# Patient Record
Sex: Male | Born: 1955 | Race: Black or African American | Hispanic: No | Marital: Married | Smoking: Current every day smoker
Health system: Southern US, Community
[De-identification: ages and names within clinical notes are randomized; demographics above are authoritative.]

## PROBLEM LIST (undated history)

## (undated) ENCOUNTER — Emergency Department (HOSPITAL_BASED_OUTPATIENT_CLINIC_OR_DEPARTMENT_OTHER)

## (undated) ENCOUNTER — Ambulatory Visit (HOSPITAL_COMMUNITY): Admission: EM | Source: Home / Self Care

## (undated) DIAGNOSIS — Z720 Tobacco use: Secondary | ICD-10-CM

## (undated) DIAGNOSIS — I10 Essential (primary) hypertension: Secondary | ICD-10-CM

## (undated) DIAGNOSIS — Y249XXA Unspecified firearm discharge, undetermined intent, initial encounter: Secondary | ICD-10-CM

## (undated) DIAGNOSIS — W3400XA Accidental discharge from unspecified firearms or gun, initial encounter: Secondary | ICD-10-CM

## (undated) DIAGNOSIS — R001 Bradycardia, unspecified: Secondary | ICD-10-CM

## (undated) DIAGNOSIS — R079 Chest pain, unspecified: Secondary | ICD-10-CM

## (undated) HISTORY — PX: LAPAROTOMY: SHX154

---

## 1997-12-06 ENCOUNTER — Emergency Department (HOSPITAL_COMMUNITY): Admission: EM | Admit: 1997-12-06 | Discharge: 1997-12-06 | Payer: Self-pay | Admitting: Emergency Medicine

## 2012-06-29 ENCOUNTER — Encounter (HOSPITAL_COMMUNITY): Payer: Self-pay | Admitting: Emergency Medicine

## 2012-06-29 ENCOUNTER — Emergency Department (HOSPITAL_COMMUNITY): Payer: Self-pay

## 2012-06-29 ENCOUNTER — Observation Stay (HOSPITAL_COMMUNITY)
Admission: EM | Admit: 2012-06-29 | Discharge: 2012-07-01 | Disposition: A | Discharge status: Home / Self Care | Payer: 59 | Attending: Cardiology | Admitting: Cardiology

## 2012-06-29 DIAGNOSIS — R0789 Other chest pain: Principal | ICD-10-CM | POA: Insufficient documentation

## 2012-06-29 DIAGNOSIS — R079 Chest pain, unspecified: Secondary | ICD-10-CM

## 2012-06-29 DIAGNOSIS — Z5900 Homelessness unspecified: Secondary | ICD-10-CM | POA: Insufficient documentation

## 2012-06-29 DIAGNOSIS — F17200 Nicotine dependence, unspecified, uncomplicated: Secondary | ICD-10-CM | POA: Insufficient documentation

## 2012-06-29 DIAGNOSIS — R9439 Abnormal result of other cardiovascular function study: Secondary | ICD-10-CM | POA: Insufficient documentation

## 2012-06-29 DIAGNOSIS — E876 Hypokalemia: Secondary | ICD-10-CM | POA: Insufficient documentation

## 2012-06-29 DIAGNOSIS — I498 Other specified cardiac arrhythmias: Secondary | ICD-10-CM | POA: Insufficient documentation

## 2012-06-29 DIAGNOSIS — I10 Essential (primary) hypertension: Secondary | ICD-10-CM | POA: Insufficient documentation

## 2012-06-29 DIAGNOSIS — R072 Precordial pain: Secondary | ICD-10-CM

## 2012-06-29 DIAGNOSIS — F419 Anxiety disorder, unspecified: Secondary | ICD-10-CM | POA: Diagnosis present

## 2012-06-29 HISTORY — DX: Bradycardia, unspecified: R00.1

## 2012-06-29 HISTORY — DX: Tobacco use: Z72.0

## 2012-06-29 HISTORY — DX: Essential (primary) hypertension: I10

## 2012-06-29 HISTORY — DX: Accidental discharge from unspecified firearms or gun, initial encounter: W34.00XA

## 2012-06-29 HISTORY — DX: Unspecified firearm discharge, undetermined intent, initial encounter: Y24.9XXA

## 2012-06-29 HISTORY — DX: Chest pain, unspecified: R07.9

## 2012-06-29 LAB — CBC WITH DIFFERENTIAL/PLATELET
Basophils Relative: 1 % (ref 0–1)
Eosinophils Absolute: 0 10*3/uL (ref 0.0–0.7)
HCT: 42.4 % (ref 39.0–52.0)
Hemoglobin: 14.6 g/dL (ref 13.0–17.0)
MCH: 32.1 pg (ref 26.0–34.0)
MCHC: 34.4 g/dL (ref 30.0–36.0)
Monocytes Absolute: 0.4 10*3/uL (ref 0.1–1.0)
Monocytes Relative: 6 % (ref 3–12)
Neutrophils Relative %: 63 % (ref 43–77)
RDW: 13 % (ref 11.5–15.5)

## 2012-06-29 LAB — CBC
Hemoglobin: 15.4 g/dL (ref 13.0–17.0)
MCH: 32.5 pg (ref 26.0–34.0)
MCHC: 34.8 g/dL (ref 30.0–36.0)
Platelets: 232 10*3/uL (ref 150–400)

## 2012-06-29 LAB — BASIC METABOLIC PANEL
BUN: 7 mg/dL (ref 6–23)
Creatinine, Ser: 0.89 mg/dL (ref 0.50–1.35)
GFR calc Af Amer: >90 mL/min (ref >90)
GFR calc non Af Amer: >90 mL/min (ref >90)

## 2012-06-29 LAB — TROPONIN I
Troponin I: <0.3 ng/mL (ref <0.30)
Troponin I: <0.3 ng/mL (ref <0.30)

## 2012-06-29 LAB — CREATININE, SERUM: Creatinine, Ser: 0.99 mg/dL (ref 0.50–1.35)

## 2012-06-29 MED ORDER — AMLODIPINE BESYLATE 5 MG PO TABS
5.0000 mg | ORAL_TABLET | Freq: Every day | ORAL | Status: DC
  Administered 2012-06-29 – 2012-06-30 (×2): 5 mg via ORAL
  Filled 2012-06-29 (×3): qty 1

## 2012-06-29 MED ORDER — HYDROCHLOROTHIAZIDE 12.5 MG PO CAPS
12.5000 mg | ORAL_CAPSULE | Freq: Every day | ORAL | Status: DC
  Administered 2012-06-30: 12.5 mg via ORAL
  Filled 2012-06-29 (×3): qty 1

## 2012-06-29 MED ORDER — ONDANSETRON HCL 4 MG/2ML IJ SOLN: 4.0000 mg | Freq: Four times a day (QID) | INTRAMUSCULAR | Status: DC | PRN

## 2012-06-29 MED ORDER — NITROGLYCERIN 0.4 MG SL SUBL
0.4000 mg | SUBLINGUAL_TABLET | SUBLINGUAL | Status: DC | PRN
  Administered 2012-06-29 (×2): 0.4 mg via SUBLINGUAL

## 2012-06-29 MED ORDER — MORPHINE SULFATE 4 MG/ML IJ SOLN
4.0000 mg | Freq: Once | INTRAMUSCULAR | Status: AC
  Administered 2012-06-29: 4 mg via INTRAVENOUS
  Filled 2012-06-29: qty 1

## 2012-06-29 MED ORDER — PANTOPRAZOLE SODIUM 40 MG PO TBEC
40.0000 mg | DELAYED_RELEASE_TABLET | Freq: Every day | ORAL | Status: DC
  Administered 2012-06-29 – 2012-06-30 (×2): 40 mg via ORAL
  Filled 2012-06-29 (×2): qty 1

## 2012-06-29 MED ORDER — ENOXAPARIN SODIUM 40 MG/0.4ML ~~LOC~~ SOLN
40.0000 mg | SUBCUTANEOUS | Status: DC
  Filled 2012-06-29 (×3): qty 0.4

## 2012-06-29 MED ORDER — ASPIRIN EC 81 MG PO TBEC
81.0000 mg | DELAYED_RELEASE_TABLET | Freq: Every day | ORAL | Status: DC
  Administered 2012-06-30: 81 mg via ORAL
  Filled 2012-06-29 (×2): qty 1

## 2012-06-29 MED ORDER — POTASSIUM CHLORIDE CRYS ER 20 MEQ PO TBCR
40.0000 meq | EXTENDED_RELEASE_TABLET | Freq: Once | ORAL | Status: AC
  Administered 2012-06-29: 40 meq via ORAL
  Filled 2012-06-29: qty 2

## 2012-06-29 MED ORDER — NITROGLYCERIN 0.4 MG SL SUBL: 0.4000 mg | SUBLINGUAL_TABLET | SUBLINGUAL | Status: DC | PRN

## 2012-06-29 MED ORDER — ACETAMINOPHEN 325 MG PO TABS: 650.0000 mg | ORAL_TABLET | ORAL | Status: DC | PRN

## 2012-06-29 NOTE — Progress Notes (Signed)
  Echocardiogram 2D Echocardiogram has been performed.  Benjamin Pierce 06/29/2012, 3:31 PM

## 2012-06-29 NOTE — Progress Notes (Signed)
Pt had 7 beat run of Vtach; pt asymptomatic. Ward Givens, paged and made aware. No new orders received. Will continue to monitor pt.
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
## 2012-06-29 NOTE — ED Notes (Signed)
Pt reports chest pain this am and numbness to left arm.

## 2012-06-30 ENCOUNTER — Observation Stay (HOSPITAL_COMMUNITY): Payer: Self-pay

## 2012-06-30 ENCOUNTER — Other Ambulatory Visit: Payer: Self-pay

## 2012-06-30 ENCOUNTER — Encounter (HOSPITAL_COMMUNITY): Payer: Self-pay | Admitting: Cardiology

## 2012-06-30 DIAGNOSIS — R079 Chest pain, unspecified: Secondary | ICD-10-CM

## 2012-06-30 LAB — BASIC METABOLIC PANEL
BUN: 12 mg/dL (ref 6–23)
CO2: 31 mEq/L (ref 19–32)
GFR calc non Af Amer: 82 mL/min — ABNORMAL LOW (ref >90)
Glucose, Bld: 106 mg/dL — ABNORMAL HIGH (ref 70–99)
Potassium: 3.9 mEq/L (ref 3.5–5.1)

## 2012-06-30 LAB — LIPID PANEL
LDL Cholesterol: 79 mg/dL (ref 0–99)
Total CHOL/HDL Ratio: 2.5 RATIO
Triglycerides: 50 mg/dL (ref <150)
VLDL: 10 mg/dL (ref 0–40)

## 2012-06-30 MED ORDER — SODIUM CHLORIDE 0.9 % IJ SOLN: 3.0000 mL | INTRAMUSCULAR | Status: DC | PRN

## 2012-06-30 MED ORDER — REGADENOSON 0.4 MG/5ML IV SOLN
INTRAVENOUS | Status: AC
  Filled 2012-06-30: qty 5

## 2012-06-30 MED ORDER — DIAZEPAM 5 MG PO TABS
5.0000 mg | ORAL_TABLET | ORAL | Status: AC
  Administered 2012-07-01: 5 mg via ORAL
  Filled 2012-06-30: qty 1

## 2012-06-30 MED ORDER — TECHNETIUM TC 99M SESTAMIBI GENERIC - CARDIOLITE
10.0000 | Freq: Once | INTRAVENOUS | Status: AC | PRN
  Administered 2012-06-30: 10 via INTRAVENOUS

## 2012-06-30 MED ORDER — TECHNETIUM TC 99M SESTAMIBI GENERIC - CARDIOLITE
30.0000 | Freq: Once | INTRAVENOUS | Status: AC | PRN
  Administered 2012-06-30: 30 via INTRAVENOUS

## 2012-06-30 MED ORDER — SODIUM CHLORIDE 0.9 % IJ SOLN: 3.0000 mL | Freq: Two times a day (BID) | INTRAMUSCULAR | Status: DC

## 2012-06-30 MED ORDER — ASPIRIN 81 MG PO CHEW
324.0000 mg | CHEWABLE_TABLET | ORAL | Status: AC
  Administered 2012-07-01: 324 mg via ORAL
  Filled 2012-06-30 (×2): qty 4

## 2012-06-30 MED ORDER — REGADENOSON 0.4 MG/5ML IV SOLN
0.4000 mg | Freq: Once | INTRAVENOUS | Status: AC
  Administered 2012-06-30: 0.4 mg via INTRAVENOUS
  Filled 2012-06-30: qty 5

## 2012-06-30 MED ORDER — SODIUM CHLORIDE 0.9 % IV SOLN: 250.0000 mL | INTRAVENOUS | Status: DC | PRN

## 2012-06-30 MED ORDER — SODIUM CHLORIDE 0.9 % IV SOLN
1.0000 mL/kg/h | INTRAVENOUS | Status: DC
  Administered 2012-07-01: 1 mL/kg/h via INTRAVENOUS

## 2012-06-30 NOTE — Care Management Note (Signed)
    Page 1 of 1   06/30/2012     4:26:13 PM   CARE MANAGEMENT NOTE 06/30/2012  Patient:  Benjamin Pierce, Benjamin Pierce   Account Number:  [id number]  Date Initiated:  06/30/2012  Documentation initiated by:  Donn Pierini  Subjective/Objective Assessment:   Pt admitted with chest pain     Action/Plan:   PTA pt was staying at the [facility]-   Anticipated DC Date:  07/01/2012   Anticipated DC Plan:  HOME/SELF CARE  In-house referral  Clinical Social Worker      DC Planning Services  CM consult      Choice offered to / List presented to:             Status of service:  In process, will continue to follow Medicare Important Message given?   (If response is "NO", the following Medicare IM given date fields will be blank) Date Medicare IM given:   Date Additional Medicare IM given:    Discharge Disposition:    Per UR Regulation:  Reviewed for med. necessity/level of care/duration of stay  If discussed at Long Length of Stay Meetings, dates discussed:    Comments:  06/30/12- 1620- Donn Pierini RN, BSN [phone] Referral for d/c needs regarding medical care- spoke with pt at bedside- list of community resources for medical clinicals for uninsured given to pt- explanation given on how clinics work and fees- number also given for [facility]- pt expressed desire to speak with CSW regarding housing needs and other resources. Consult placed to CSW.

## 2012-06-30 NOTE — Progress Notes (Signed)
Utilization review completed.  

## 2012-06-30 NOTE — Progress Notes (Signed)
Nuclear stress test completed. Awaiting final results.  [place], PA-C 06/30/2012 2:09 PM  [phone] pgr
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
## 2012-06-30 NOTE — Progress Notes (Signed)
Pt brady down into 40s. Pt asymptomatic and sleeping at this time. Will continue to monitor. Levonne Spiller, RN

## 2012-06-30 NOTE — ED Provider Notes (Signed)
Medical screening examination/treatment/procedure(s) were performed by non-physician practitioner and as supervising physician I was immediately available for consultation/collaboration.   Lyanne Co, MD 06/30/12 [phone]
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
## 2012-07-01 ENCOUNTER — Encounter (HOSPITAL_COMMUNITY)
Admission: EM | Disposition: A | Discharge status: Home / Self Care | Payer: Self-pay | Source: Home / Self Care | Attending: Emergency Medicine

## 2012-07-01 ENCOUNTER — Encounter (HOSPITAL_COMMUNITY): Payer: Self-pay | Admitting: Cardiology

## 2012-07-01 DIAGNOSIS — R079 Chest pain, unspecified: Secondary | ICD-10-CM

## 2012-07-01 HISTORY — PX: LEFT HEART CATHETERIZATION WITH CORONARY ANGIOGRAM: SHX5451

## 2012-07-01 LAB — BASIC METABOLIC PANEL
BUN: 14 mg/dL (ref 6–23)
CO2: 27 mEq/L (ref 19–32)
Chloride: 105 mEq/L (ref 96–112)
GFR calc Af Amer: 90 mL/min — ABNORMAL LOW (ref >90)
Potassium: 3.4 mEq/L — ABNORMAL LOW (ref 3.5–5.1)

## 2012-07-01 LAB — APTT: aPTT: 33 seconds (ref 24–37)

## 2012-07-01 LAB — PROTIME-INR: Prothrombin Time: 12.4 seconds (ref 11.6–15.2)

## 2012-07-01 SURGERY — LEFT HEART CATHETERIZATION WITH CORONARY ANGIOGRAM
Anesthesia: LOCAL

## 2012-07-01 MED ORDER — HEPARIN (PORCINE) IN NACL 2-0.9 UNIT/ML-% IJ SOLN
INTRAMUSCULAR | Status: AC
  Filled 2012-07-01: qty 1000

## 2012-07-01 MED ORDER — VERAPAMIL HCL 2.5 MG/ML IV SOLN
INTRAVENOUS | Status: AC
  Filled 2012-07-01: qty 2

## 2012-07-01 MED ORDER — ACETAMINOPHEN 325 MG PO TABS: 650.0000 mg | ORAL_TABLET | ORAL | Status: DC | PRN

## 2012-07-01 MED ORDER — LIDOCAINE HCL (PF) 1 % IJ SOLN
INTRAMUSCULAR | Status: AC
  Filled 2012-07-01: qty 30

## 2012-07-01 MED ORDER — OXYCODONE-ACETAMINOPHEN 5-325 MG PO TABS: 1.0000 | ORAL_TABLET | ORAL | Status: DC | PRN

## 2012-07-01 MED ORDER — AMLODIPINE BESYLATE 10 MG PO TABS: 10.0000 mg | ORAL_TABLET | Freq: Every day | ORAL | Status: DC

## 2012-07-01 MED ORDER — FENTANYL CITRATE 0.05 MG/ML IJ SOLN
INTRAMUSCULAR | Status: AC
  Filled 2012-07-01: qty 2

## 2012-07-01 MED ORDER — POTASSIUM CHLORIDE CRYS ER 20 MEQ PO TBCR
40.0000 meq | EXTENDED_RELEASE_TABLET | Freq: Once | ORAL | Status: AC
  Administered 2012-07-01: 40 meq via ORAL
  Filled 2012-07-01: qty 2

## 2012-07-01 MED ORDER — SODIUM CHLORIDE 0.9 % IV SOLN: INTRAVENOUS | Status: AC

## 2012-07-01 MED ORDER — DIAZEPAM 2 MG PO TABS: 2.0000 mg | ORAL_TABLET | ORAL | Status: DC | PRN

## 2012-07-01 MED ORDER — MIDAZOLAM HCL 2 MG/2ML IJ SOLN
INTRAMUSCULAR | Status: AC
  Filled 2012-07-01: qty 2

## 2012-07-01 MED ORDER — HEPARIN SODIUM (PORCINE) 1000 UNIT/ML IJ SOLN
INTRAMUSCULAR | Status: AC
  Filled 2012-07-01: qty 1

## 2012-07-01 MED ORDER — ONDANSETRON HCL 4 MG/2ML IJ SOLN: 4.0000 mg | Freq: Four times a day (QID) | INTRAMUSCULAR | Status: DC | PRN
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
## 2012-07-01 NOTE — Progress Notes (Signed)
Pt provided with dc instructions and education. Pt verbalized understanding. Pt provided with prescription for new med and knows how to take it. IV removed with tip intact. Heart monitor cleaned and returned to front. Pt awaiting social work to bring a bus pass. Levonne Spiller, RN

## 2012-07-01 NOTE — CV Procedure (Signed)
   Cardiac Catheterization Procedure Note  Name: Benjamin Pierce MRN: [id number] DOB: 1955-10-23  Procedure: Left Heart Cath, Selective Coronary Angiography, LV angiography  Indication: Chest Pain Abnormal myovue   Procedural Details: The right wrist was prepped, draped, and anesthetized with 1% lidocaine. Using the modified Seldinger technique, a 5 French sheath was introduced into the right radial artery. 3 mg of verapamil was administered through the sheath, weight-based unfractionated heparin was administered intravenously. Standard Judkins catheters were used for selective coronary angiography and left ventriculography. Catheter exchanges were performed over an exchange length guidewire. There were no immediate procedural complications. A TR band was used for radial hemostasis at the completion of the procedure.  The patient was transferred to the post catheterization recovery area for further monitoring.  Procedural Findings: Hemodynamics: AO  105 65 LV 107 13  Coronary angiography: Coronary dominance: right  Left mainstem: Normal  Left anterior descending (LAD): Normal  D1: large artery and normal  D2: normal   Left circumflex (LCx): normal  OM1: large branch normal  Right coronary artery (RCA): Normal  PDA: small as most of mid and apical inferior wall supplied by wrap around LAD Normal  Left ventriculography: Left ventricular systolic function is normal, LVEF is estimated at 55-65%, there is no significant mitral regurgitation   Final Conclusions:  Normal Coronary Arteries  Recommendations: No CAD  Discharge home today False positive myovue  Charlton Haws 07/01/2012, 9:26 AM

## 2012-07-01 NOTE — Interval H&P Note (Signed)
History and Physical Interval Note:  07/01/2012 8:48 AM  Benjamin Pierce  has presented today for surgery, with the diagnosis of cp  The various methods of treatment have been discussed with the patient and family. After consideration of risks, benefits and other options for treatment, the patient has consented to  Procedure(s): LEFT HEART CATHETERIZATION WITH CORONARY ANGIOGRAM (N/A) as a surgical intervention .  The patient's history has been reviewed, patient examined, no change in status, stable for surgery.  I have reviewed the patient's chart and labs.  Questions were answered to the patient's satisfaction.     Charlton Haws

## 2012-07-02 ENCOUNTER — Telehealth: Payer: Self-pay | Admitting: *Deleted

## 2012-07-02 NOTE — Telephone Encounter (Signed)
TOC management pc to pt. Patient is homeless. Listed shelter as his home address but he did not come to phone when his name was called by the employee at the shelter.  There is no one listed as an emergency contact.

## 2012-10-17 ENCOUNTER — Emergency Department (HOSPITAL_COMMUNITY): Payer: Self-pay

## 2012-10-17 ENCOUNTER — Encounter (HOSPITAL_COMMUNITY): Payer: Self-pay | Admitting: *Deleted

## 2012-10-17 ENCOUNTER — Inpatient Hospital Stay (HOSPITAL_COMMUNITY)
Admission: EM | Admit: 2012-10-17 | Discharge: 2012-10-21 | DRG: 605 | Disposition: A | Discharge status: Home / Self Care | Payer: MEDICAID | Attending: General Surgery | Admitting: General Surgery

## 2012-10-17 DIAGNOSIS — E041 Nontoxic single thyroid nodule: Secondary | ICD-10-CM | POA: Diagnosis present

## 2012-10-17 DIAGNOSIS — F1410 Cocaine abuse, uncomplicated: Secondary | ICD-10-CM | POA: Diagnosis present

## 2012-10-17 DIAGNOSIS — S0003XA Contusion of scalp, initial encounter: Secondary | ICD-10-CM | POA: Diagnosis present

## 2012-10-17 DIAGNOSIS — Z5900 Homelessness unspecified: Secondary | ICD-10-CM

## 2012-10-17 DIAGNOSIS — S0181XA Laceration without foreign body of other part of head, initial encounter: Secondary | ICD-10-CM

## 2012-10-17 DIAGNOSIS — F17200 Nicotine dependence, unspecified, uncomplicated: Secondary | ICD-10-CM | POA: Diagnosis present

## 2012-10-17 DIAGNOSIS — F329 Major depressive disorder, single episode, unspecified: Secondary | ICD-10-CM | POA: Diagnosis present

## 2012-10-17 DIAGNOSIS — S1093XA Contusion of unspecified part of neck, initial encounter: Secondary | ICD-10-CM | POA: Diagnosis present

## 2012-10-17 DIAGNOSIS — IMO0002 Reserved for concepts with insufficient information to code with codable children: Secondary | ICD-10-CM

## 2012-10-17 DIAGNOSIS — S0230XA Fracture of orbital floor, unspecified side, initial encounter for closed fracture: Secondary | ICD-10-CM | POA: Diagnosis present

## 2012-10-17 DIAGNOSIS — S060X0A Concussion without loss of consciousness, initial encounter: Secondary | ICD-10-CM | POA: Diagnosis present

## 2012-10-17 DIAGNOSIS — S0280XA Fracture of other specified skull and facial bones, unspecified side, initial encounter for closed fracture: Secondary | ICD-10-CM | POA: Diagnosis present

## 2012-10-17 DIAGNOSIS — R4182 Altered mental status, unspecified: Secondary | ICD-10-CM | POA: Diagnosis present

## 2012-10-17 DIAGNOSIS — S0180XA Unspecified open wound of other part of head, initial encounter: Secondary | ICD-10-CM

## 2012-10-17 DIAGNOSIS — I10 Essential (primary) hypertension: Secondary | ICD-10-CM | POA: Diagnosis present

## 2012-10-17 DIAGNOSIS — S060XAA Concussion with loss of consciousness status unknown, initial encounter: Secondary | ICD-10-CM

## 2012-10-17 DIAGNOSIS — F3289 Other specified depressive episodes: Secondary | ICD-10-CM | POA: Diagnosis present

## 2012-10-17 DIAGNOSIS — S02411A LeFort I fracture, initial encounter for closed fracture: Secondary | ICD-10-CM

## 2012-10-17 DIAGNOSIS — Y929 Unspecified place or not applicable: Secondary | ICD-10-CM

## 2012-10-17 DIAGNOSIS — S02670A Fracture of alveolus of mandible, unspecified side, initial encounter for closed fracture: Secondary | ICD-10-CM | POA: Diagnosis present

## 2012-10-17 DIAGNOSIS — S060X9A Concussion with loss of consciousness of unspecified duration, initial encounter: Secondary | ICD-10-CM

## 2012-10-17 DIAGNOSIS — I498 Other specified cardiac arrhythmias: Secondary | ICD-10-CM | POA: Diagnosis present

## 2012-10-17 LAB — URINALYSIS, ROUTINE W REFLEX MICROSCOPIC
Nitrite: NEGATIVE
Protein, ur: NEGATIVE mg/dL
Specific Gravity, Urine: 1.028 (ref 1.005–1.030)
Urobilinogen, UA: 1 mg/dL (ref 0.0–1.0)

## 2012-10-17 LAB — COMPREHENSIVE METABOLIC PANEL
Albumin: 3.6 g/dL (ref 3.5–5.2)
BUN: 19 mg/dL (ref 6–23)
Creatinine, Ser: 1.01 mg/dL (ref 0.50–1.35)
GFR calc Af Amer: >90 mL/min (ref >90)
Glucose, Bld: 89 mg/dL (ref 70–99)
Total Bilirubin: 0.5 mg/dL (ref 0.3–1.2)
Total Protein: 7.8 g/dL (ref 6.0–8.3)

## 2012-10-17 LAB — ACETAMINOPHEN LEVEL: Acetaminophen (Tylenol), Serum: <15 ug/mL (ref 10–30)

## 2012-10-17 LAB — CBC WITH DIFFERENTIAL/PLATELET
Basophils Absolute: 0 10*3/uL (ref 0.0–0.1)
Eosinophils Absolute: 0 10*3/uL (ref 0.0–0.7)
Hemoglobin: 14.7 g/dL (ref 13.0–17.0)
Lymphs Abs: 1.8 10*3/uL (ref 0.7–4.0)
MCH: 32.5 pg (ref 26.0–34.0)
MCHC: 34.9 g/dL (ref 30.0–36.0)
MCV: 93.1 fL (ref 78.0–100.0)
Neutro Abs: 7.4 10*3/uL (ref 1.7–7.7)
RBC: 4.52 MIL/uL (ref 4.22–5.81)
RDW: 12.7 % (ref 11.5–15.5)
WBC: 9.8 10*3/uL (ref 4.0–10.5)

## 2012-10-17 LAB — RAPID URINE DRUG SCREEN, HOSP PERFORMED
Barbiturates: NOT DETECTED
Tetrahydrocannabinol: NOT DETECTED

## 2012-10-17 LAB — ETHANOL: Alcohol, Ethyl (B): <11 mg/dL (ref 0–11)

## 2012-10-17 LAB — SALICYLATE LEVEL: Salicylate Lvl: <2 mg/dL — ABNORMAL LOW (ref 2.8–20.0)

## 2012-10-17 MED ORDER — ONDANSETRON HCL 4 MG PO TABS: 4.0000 mg | ORAL_TABLET | Freq: Four times a day (QID) | ORAL | Status: DC | PRN

## 2012-10-17 MED ORDER — TETANUS-DIPHTH-ACELL PERTUSSIS 5-2.5-18.5 LF-MCG/0.5 IM SUSP
0.5000 mL | Freq: Once | INTRAMUSCULAR | Status: AC
  Administered 2012-10-17: 0.5 mL via INTRAMUSCULAR
  Filled 2012-10-17: qty 0.5

## 2012-10-17 MED ORDER — HYDROMORPHONE HCL PF 1 MG/ML IJ SOLN
0.5000 mg | INTRAMUSCULAR | Status: DC | PRN
  Administered 2012-10-18 (×3): 1 mg via INTRAVENOUS
  Administered 2012-10-18: 0.5 mg via INTRAVENOUS
  Administered 2012-10-18 – 2012-10-20 (×9): 1 mg via INTRAVENOUS
  Filled 2012-10-17 (×13): qty 1

## 2012-10-17 MED ORDER — BIOTENE DRY MOUTH MT LIQD
15.0000 mL | OROMUCOSAL | Status: DC
  Administered 2012-10-17 – 2012-10-21 (×37): 15 mL via OROMUCOSAL

## 2012-10-17 MED ORDER — KCL IN DEXTROSE-NACL 20-5-0.45 MEQ/L-%-% IV SOLN
INTRAVENOUS | Status: DC
  Administered 2012-10-17 – 2012-10-18 (×4): via INTRAVENOUS
  Filled 2012-10-17 (×6): qty 1000

## 2012-10-17 MED ORDER — LORAZEPAM 1 MG PO TABS
1.0000 mg | ORAL_TABLET | Freq: Once | ORAL | Status: DC
  Filled 2012-10-17: qty 1

## 2012-10-17 MED ORDER — LORAZEPAM 2 MG/ML IJ SOLN
2.0000 mg | Freq: Once | INTRAMUSCULAR | Status: AC
  Administered 2012-10-17: 2 mg via INTRAMUSCULAR

## 2012-10-17 MED ORDER — ACETAMINOPHEN 325 MG PO TABS
650.0000 mg | ORAL_TABLET | ORAL | Status: DC | PRN
  Administered 2012-10-17 – 2012-10-20 (×7): 650 mg via ORAL
  Filled 2012-10-17 (×8): qty 2

## 2012-10-17 MED ORDER — LORAZEPAM 2 MG/ML IJ SOLN
INTRAMUSCULAR | Status: AC
  Filled 2012-10-17: qty 1

## 2012-10-17 MED ORDER — ONDANSETRON HCL 4 MG/2ML IJ SOLN
4.0000 mg | Freq: Four times a day (QID) | INTRAMUSCULAR | Status: DC | PRN
  Administered 2012-10-20: 4 mg via INTRAVENOUS
  Filled 2012-10-17: qty 2

## 2012-10-17 MED ORDER — LORAZEPAM 2 MG/ML IJ SOLN: 2.0000 mg | Freq: Once | INTRAMUSCULAR | Status: DC
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
## 2012-10-17 NOTE — ED Notes (Signed)
Patient transported to CT 
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
## 2012-10-17 NOTE — ED Provider Notes (Signed)
Medical screening examination/treatment/procedure(s) were performed by non-physician practitioner and as supervising physician I was immediately available for consultation/collaboration.   Richardean Canal, MD 10/17/12 [phone]

## 2012-10-17 NOTE — H&P (Signed)
Seen, agree with above.    Allow cocaine and ativan to clear.  If still depressed mental status tomorrow, consider repeat scan and possible neuro/neurosurg consult.

## 2012-10-17 NOTE — ED Notes (Signed)
PA at bedside to suture lip laceration.

## 2012-10-17 NOTE — ED Notes (Signed)
MD at bedside. 

## 2012-10-17 NOTE — ED Notes (Deleted)
Pt ambulating independently w/ steady gait on d/c in no acute distress, A&Ox4. D/c instructions reviewed w/ pt and family - pt and family deny any further questions or concerns at present. Rx given x1  
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
## 2012-10-17 NOTE — Progress Notes (Signed)
UR completed 

## 2012-10-18 LAB — CBC
Hemoglobin: 14.2 g/dL (ref 13.0–17.0)
MCH: 32.3 pg (ref 26.0–34.0)
MCV: 92.7 fL (ref 78.0–100.0)
RBC: 4.39 MIL/uL (ref 4.22–5.81)

## 2012-10-18 LAB — BASIC METABOLIC PANEL
CO2: 26 mEq/L (ref 19–32)
Calcium: 8.4 mg/dL (ref 8.4–10.5)
Glucose, Bld: 112 mg/dL — ABNORMAL HIGH (ref 70–99)
Sodium: 137 mEq/L (ref 135–145)

## 2012-10-18 MED ORDER — KCL IN DEXTROSE-NACL 20-5-0.45 MEQ/L-%-% IV SOLN
INTRAVENOUS | Status: DC
  Administered 2012-10-18: 16:00:00 via INTRAVENOUS
  Administered 2012-10-19 (×2): 20 mL/h via INTRAVENOUS
  Filled 2012-10-18 (×3): qty 1000
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
## 2012-10-18 NOTE — Progress Notes (Signed)
Report called and given to [place], rn.
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
## 2012-10-19 DIAGNOSIS — E041 Nontoxic single thyroid nodule: Secondary | ICD-10-CM

## 2012-10-19 MED ORDER — HYDROMORPHONE HCL 2 MG PO TABS
2.0000 mg | ORAL_TABLET | ORAL | Status: DC | PRN
  Administered 2012-10-19 – 2012-10-20 (×2): 2 mg via ORAL
  Filled 2012-10-19 (×2): qty 1
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
## 2012-10-20 ENCOUNTER — Inpatient Hospital Stay (HOSPITAL_COMMUNITY): Payer: 59

## 2012-10-20 DIAGNOSIS — S060X9A Concussion with loss of consciousness of unspecified duration, initial encounter: Secondary | ICD-10-CM

## 2012-10-20 MED ORDER — HYDROMORPHONE HCL PF 1 MG/ML IJ SOLN
0.5000 mg | INTRAMUSCULAR | Status: DC | PRN
  Administered 2012-10-21: 0.5 mg via INTRAVENOUS
  Filled 2012-10-20: qty 1

## 2012-10-20 MED ORDER — OXYCODONE HCL 5 MG PO TABS
5.0000 mg | ORAL_TABLET | ORAL | Status: DC | PRN
  Administered 2012-10-20 – 2012-10-21 (×5): 15 mg via ORAL
  Filled 2012-10-20 (×5): qty 3
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
## 2012-10-20 NOTE — Progress Notes (Signed)
Had episode of emesis prior to trying oral pain medications.  Also having HA so likely due to post concussion syndrome.  These symptoms will need to improve prior to D/C. Patient examined and I agree with the assessment and plan  Violeta Gelinas, MD, MPH, FACS Pager: [phone]  10/20/2012 10:49 AM

## 2012-10-21 MED ORDER — LISINOPRIL 2.5 MG PO TABS: 2.5000 mg | ORAL_TABLET | Freq: Every day | ORAL | Status: DC

## 2012-10-21 MED ORDER — OXYCODONE-ACETAMINOPHEN 10-325 MG PO TABS: 1.0000 | ORAL_TABLET | ORAL | Status: AC | PRN

## 2012-10-21 NOTE — Discharge Summary (Signed)
Stable for discharge This patient has been seen and I agree with the findings and treatment plan.  Ranveer O. Rosangela Fehrenbach, III, MD, FACS [phone] (pager) [phone] (direct pager) Trauma Surgeon  

## 2012-10-21 NOTE — Clinical Social Work Note (Signed)
Clinical Social Worker received information from NP that patient was needing clothes, transportation, and shelter options for a discharge today.  CSW spoke with RN over the phone who states that once patient was informed of his discharge today he was able to make arrangements for somewhere to stay, transportation, and clothes.  Patient was set up with [facility] program through CM.  Patient discharged in stable condition with an adequate ride and clothing.  Clinical Social Worker will sign off for now as social work intervention is no longer needed. Please consult [place] again if new need arises.  Macario Golds, [place] [phone]

## 2012-10-21 NOTE — Progress Notes (Signed)
Patient discharged in stable condition ambulatory. Discharge instructions and prescriptions were given and explained. Patient states that he "has a ride and somewhere to stay, does not need to see Child psychotherapist."

## 2012-10-21 NOTE — Progress Notes (Signed)
Pt enrolled in [facility] program through the hospital to provide his medications at discharge, one time, for a $0 copay.  Explained process to patient and he stated understanding. Letter placed in physical chart with his discharge prescriptions.
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
## 2014-04-15 IMAGING — US US SOFT TISSUE HEAD/NECK
1 series · 14 of 25 positions shown · non-contrast
Comparison: CT 10/17/2012

CLINICAL DATA: Calcified right thyroid nodule noted on recent neck
CT

THYROID ULTRASOUND
TECHNIQUE: Ultrasound examination of the thyroid gland and adjacent
soft tissues was performed.

[Series 1: us soft tissue head/neck · 0.06mm/px · 14 of 44 slices shown]
[im 1/44]
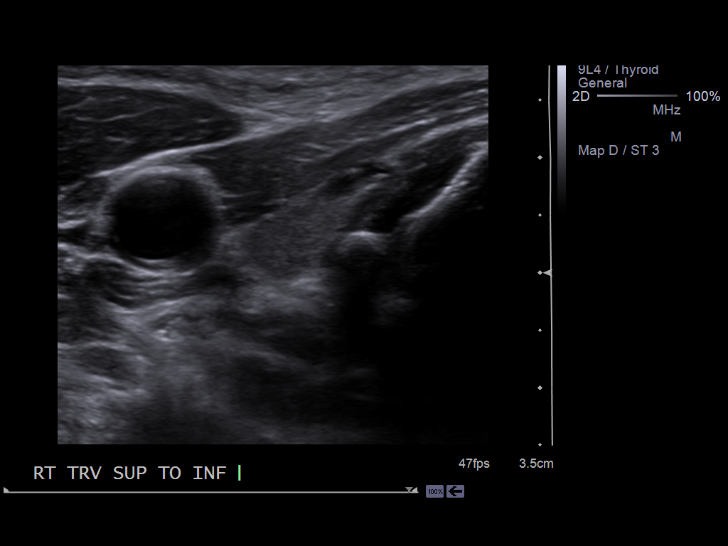
[im 4/44]
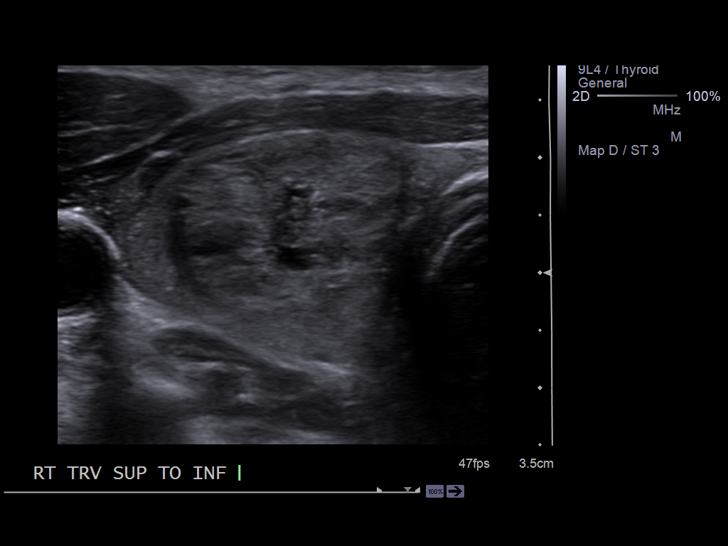
[im 8/44]
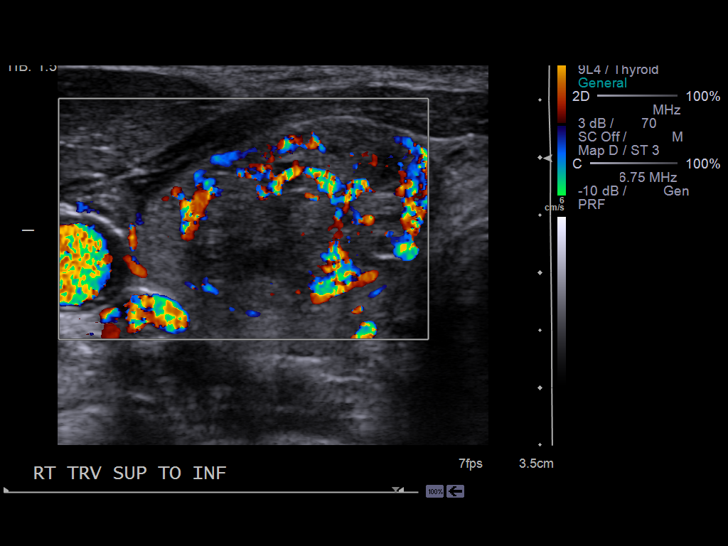
[im 11/44]
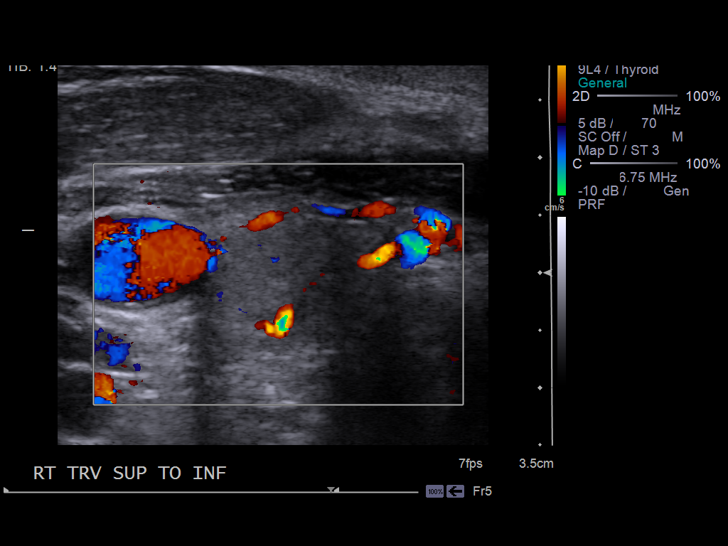
[im 15/44]
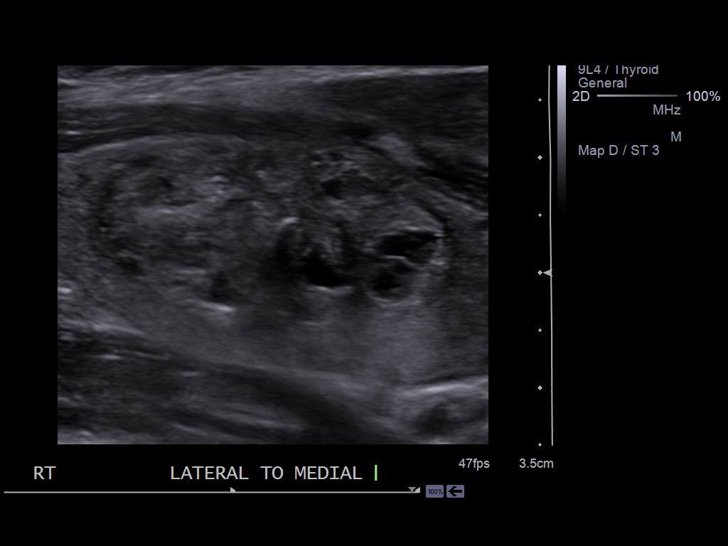
[im 17/44]
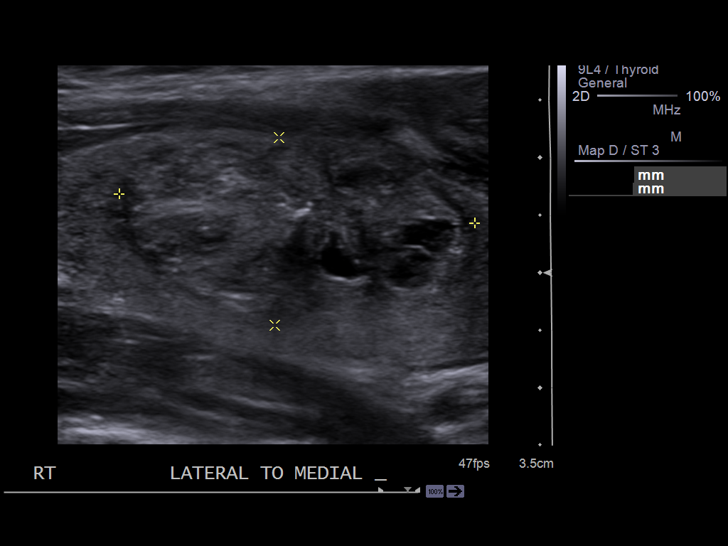
[im 20/44]
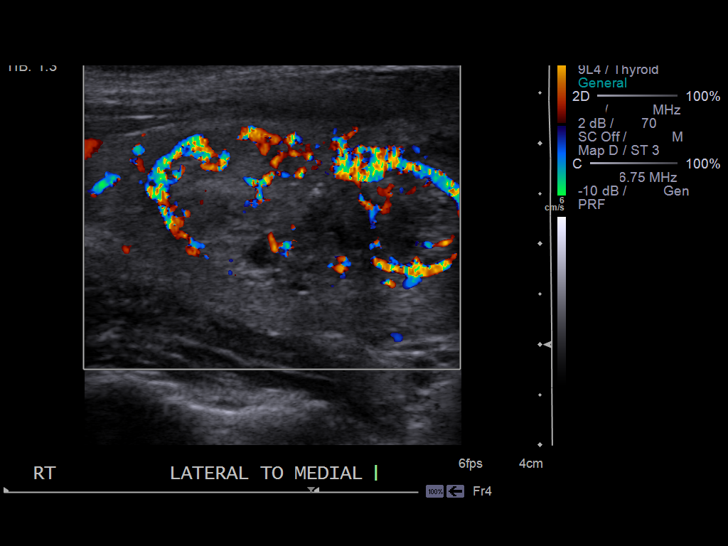
[im 24/44]
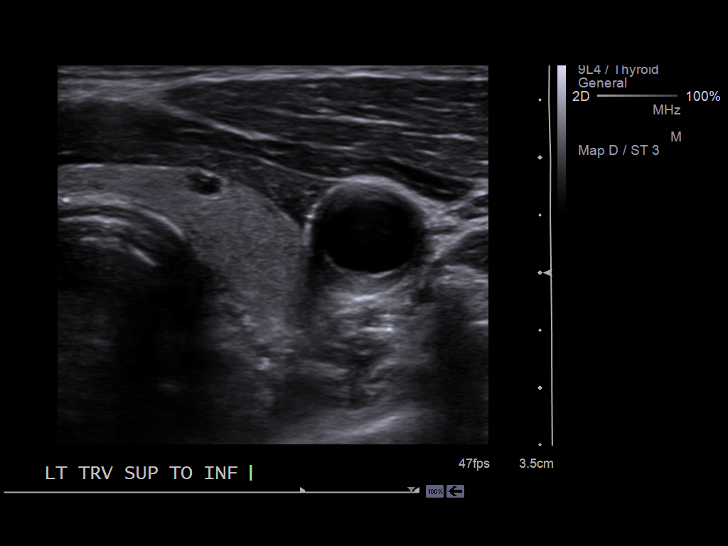
[im 27/44]
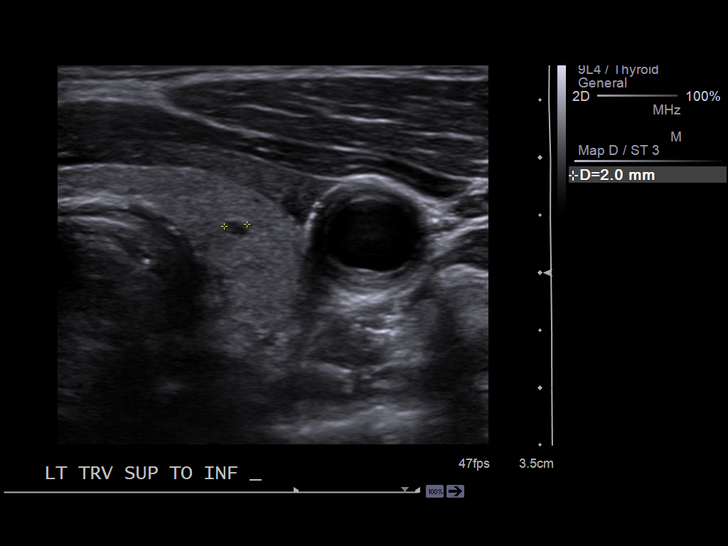
[im 29/44]
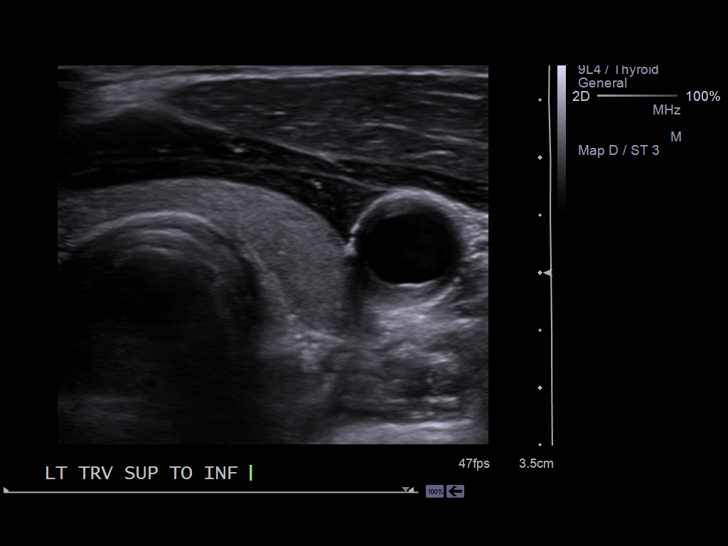
[im 33/44]
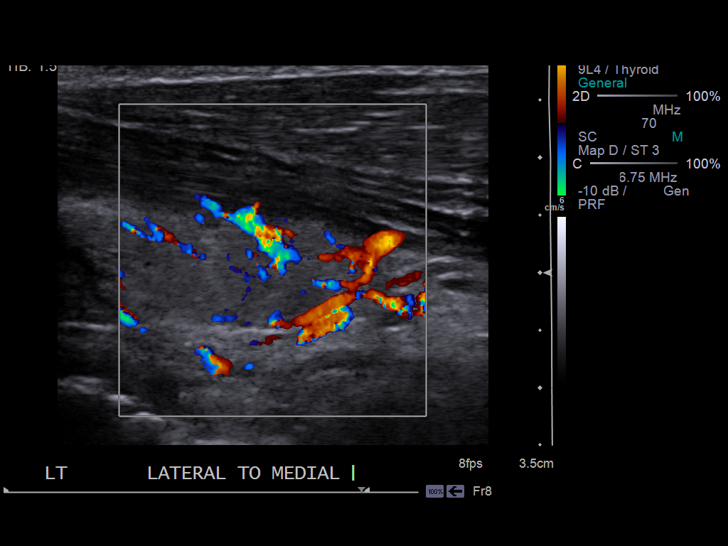
[im 36/44]
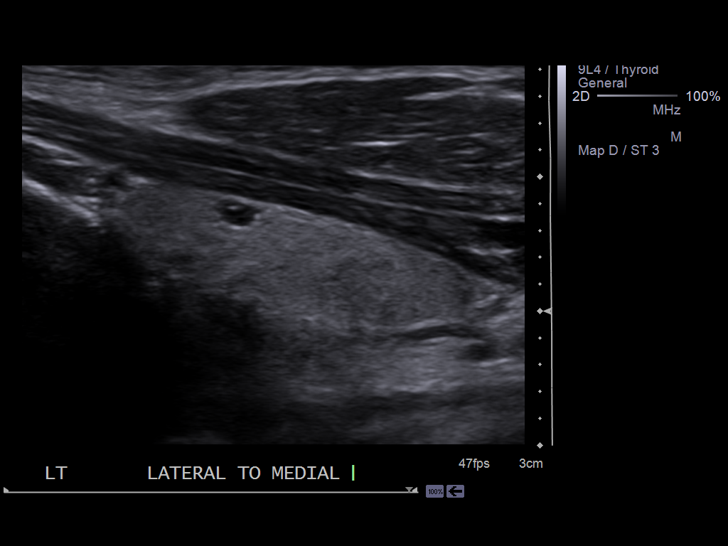
[im 40/44]
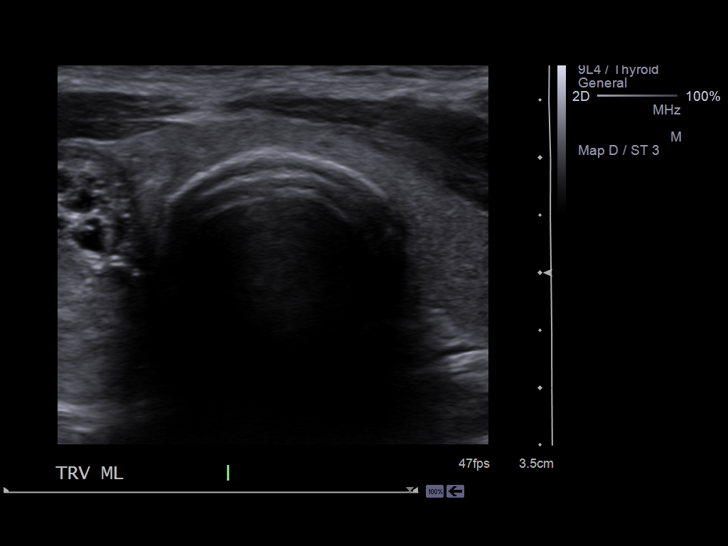
[im 44/44]
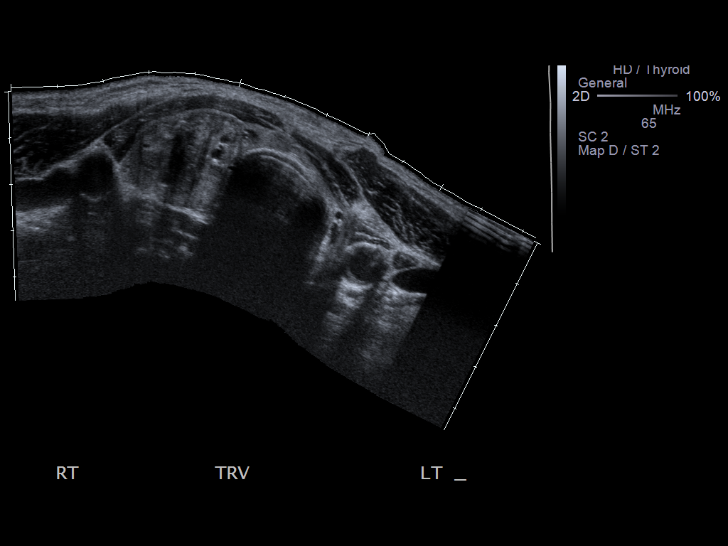

[14 of 25 positions shown; findings below may reference images not displayed]

FINDINGS: Right thyroid lobe:  26 x 27 x 51 mm, homogeneous background
echotexture
Left thyroid lobe:  10 x 12 x 30 mm with two tiny cysts
Isthmus:  3 mm in thickness

Focal nodules:  16 x 23 x 31 mm solid, corresponding to region seen
on the previous scan

Lymphadenopathy:  None visualized.
IMPRESSION: Solitary right 3.1 cm thyroid nodule. Findings meet consensus
criteria for biopsy.  Ultrasound-guided fine needle aspiration
should be considered, as per the consensus statement: Management of
Thyroid Nodules Detected at [place]:  Society of Radiologists in
800.

## 2014-04-22 ENCOUNTER — Encounter (HOSPITAL_COMMUNITY): Payer: Self-pay | Admitting: Cardiovascular Disease

## 2018-07-31 ENCOUNTER — Other Ambulatory Visit: Payer: Self-pay

## 2018-07-31 ENCOUNTER — Emergency Department (HOSPITAL_COMMUNITY)
Admission: EM | Admit: 2018-07-31 | Discharge: 2018-08-01 | Disposition: A | Discharge status: Home / Self Care | Payer: Medicaid Other | Attending: Emergency Medicine | Admitting: Emergency Medicine

## 2018-07-31 DIAGNOSIS — F17210 Nicotine dependence, cigarettes, uncomplicated: Secondary | ICD-10-CM | POA: Insufficient documentation

## 2018-07-31 DIAGNOSIS — I10 Essential (primary) hypertension: Secondary | ICD-10-CM | POA: Insufficient documentation

## 2018-07-31 DIAGNOSIS — Z79899 Other long term (current) drug therapy: Secondary | ICD-10-CM | POA: Insufficient documentation

## 2018-07-31 DIAGNOSIS — F10920 Alcohol use, unspecified with intoxication, uncomplicated: Secondary | ICD-10-CM

## 2018-07-31 DIAGNOSIS — F10220 Alcohol dependence with intoxication, uncomplicated: Secondary | ICD-10-CM | POA: Diagnosis present
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
## 2018-07-31 NOTE — ED Triage Notes (Signed)
Pt brought in by [facility], pt found at gas station crawling on ground. Pt reports drinking "not that much wine". Pt reports drinking 3-4 bottles of wine. Pt reports feeling dizzy. Pt denies pain. Pt able to answer name and birthday, reports the year is [birth year].

## 2018-07-31 NOTE — Discharge Instructions (Signed)
Please read and follow all provided instructions.  Your diagnoses today include:  1. Alcoholic intoxication without complication (HCC)     Tests performed today include:  Vital signs. See below for your results today.   Medications prescribed:   None  Home care instructions:  Follow any educational materials contained in this packet.  Avoid heavy use of alcohol.   Follow-up instructions: Please follow-up with your primary care provider as needed for further evaluation of your symptoms.  Return instructions:   Please return to the Emergency Department if you experience worsening symptoms.   Please return if you have any other emergent concerns.  Additional Information:  Your vital signs today were: BP 139/84    Pulse 72    Temp 98.6 F (37 C) (Oral)    Resp 10    SpO2 (!) 84%  If your blood pressure (BP) was elevated above 135/85 this visit, please have this repeated by your doctor within one month. ---------------

## 2018-08-01 NOTE — ED Notes (Signed)
Pt unable to sign for discharge, he verbally consents to discharge, provided time for questions, answers provided. Pt discharged to be picked up by wife.

## 2018-08-01 NOTE — ED Notes (Signed)
Pt given sandwich and ginger ale. Pt spoke with wife who is on the way to pick up pt, Josh PA made aware.

## 2018-08-01 NOTE — ED Notes (Signed)
Pt spoke with wife who states she is almost at the hospital.

## 2018-08-01 NOTE — ED Notes (Signed)
Pt ambulatory to bathroom without difficulty.  

## 2019-01-24 ENCOUNTER — Emergency Department (HOSPITAL_COMMUNITY)
Admission: EM | Admit: 2019-01-24 | Discharge: 2019-01-25 | Disposition: A | Discharge status: Home / Self Care | Payer: Medicaid Other | Attending: Emergency Medicine | Admitting: Emergency Medicine

## 2019-01-24 DIAGNOSIS — R110 Nausea: Secondary | ICD-10-CM | POA: Diagnosis not present

## 2019-01-24 DIAGNOSIS — R55 Syncope and collapse: Secondary | ICD-10-CM | POA: Diagnosis not present

## 2019-01-24 DIAGNOSIS — R000 Tachycardia, unspecified: Secondary | ICD-10-CM | POA: Diagnosis not present

## 2019-01-24 DIAGNOSIS — R4182 Altered mental status, unspecified: Secondary | ICD-10-CM | POA: Diagnosis present

## 2019-01-24 DIAGNOSIS — N179 Acute kidney failure, unspecified: Secondary | ICD-10-CM | POA: Diagnosis not present

## 2019-01-24 DIAGNOSIS — Z036 Encounter for observation for suspected toxic effect from ingested substance ruled out: Secondary | ICD-10-CM | POA: Insufficient documentation

## 2019-01-24 DIAGNOSIS — R61 Generalized hyperhidrosis: Secondary | ICD-10-CM | POA: Diagnosis not present

## 2019-01-24 DIAGNOSIS — I10 Essential (primary) hypertension: Secondary | ICD-10-CM | POA: Insufficient documentation

## 2019-01-24 DIAGNOSIS — F17210 Nicotine dependence, cigarettes, uncomplicated: Secondary | ICD-10-CM | POA: Diagnosis not present
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
## 2019-01-24 NOTE — ED Notes (Signed)
ED Provider at bedside. 

## 2019-01-25 LAB — CBC WITH DIFFERENTIAL/PLATELET
Abs Immature Granulocytes: 0.02 10*3/uL (ref 0.00–0.07)
Basophils Absolute: 0 10*3/uL (ref 0.0–0.1)
Basophils Relative: 1 %
Eosinophils Absolute: 0 10*3/uL (ref 0.0–0.5)
Eosinophils Relative: 0 %
HCT: 45.5 % (ref 39.0–52.0)
Hemoglobin: 15.4 g/dL (ref 13.0–17.0)
Immature Granulocytes: 0 %
Lymphocytes Relative: 16 %
Lymphs Abs: 1.3 10*3/uL (ref 0.7–4.0)
MCH: 32 pg (ref 26.0–34.0)
MCHC: 33.8 g/dL (ref 30.0–36.0)
MCV: 94.4 fL (ref 80.0–100.0)
Monocytes Absolute: 0.4 10*3/uL (ref 0.1–1.0)
Monocytes Relative: 5 %
Neutro Abs: 6.5 10*3/uL (ref 1.7–7.7)
Neutrophils Relative %: 78 %
Platelets: 221 10*3/uL (ref 150–400)
RBC: 4.82 MIL/uL (ref 4.22–5.81)
RDW: 13.1 % (ref 11.5–15.5)
WBC: 8.3 10*3/uL (ref 4.0–10.5)
nRBC: 0 % (ref 0.0–0.2)

## 2019-01-25 LAB — COMPREHENSIVE METABOLIC PANEL
ALT: 15 U/L (ref 0–44)
AST: 24 U/L (ref 15–41)
Albumin: 3.8 g/dL (ref 3.5–5.0)
Alkaline Phosphatase: 72 U/L (ref 38–126)
Anion gap: 14 (ref 5–15)
BUN: 21 mg/dL (ref 8–23)
CO2: 17 mmol/L — ABNORMAL LOW (ref 22–32)
Calcium: 9.1 mg/dL (ref 8.9–10.3)
Chloride: 106 mmol/L (ref 98–111)
Creatinine, Ser: 1.55 mg/dL — ABNORMAL HIGH (ref 0.61–1.24)
GFR calc Af Amer: 55 mL/min — ABNORMAL LOW (ref >60)
GFR calc non Af Amer: 47 mL/min — ABNORMAL LOW (ref >60)
Glucose, Bld: 146 mg/dL — ABNORMAL HIGH (ref 70–99)
Potassium: 3.6 mmol/L (ref 3.5–5.1)
Sodium: 137 mmol/L (ref 135–145)
Total Bilirubin: 0.4 mg/dL (ref 0.3–1.2)
Total Protein: 7.5 g/dL (ref 6.5–8.1)

## 2019-01-25 LAB — TROPONIN I (HIGH SENSITIVITY): Troponin I (High Sensitivity): 9 ng/L (ref <18)

## 2019-01-25 LAB — ETHANOL: Alcohol, Ethyl (B): <10 mg/dL (ref <10)

## 2019-01-25 MED ORDER — LACTATED RINGERS IV BOLUS
1000.0000 mL | Freq: Once | INTRAVENOUS | Status: AC
  Administered 2019-01-25: 1000 mL via INTRAVENOUS
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
## 2019-01-25 NOTE — ED Notes (Signed)
Patient verbalizes understanding of discharge instructions. Opportunity for questioning and answers were provided. Armband removed by staff, pt discharged from ED ambulatory.   

## 2021-12-28 ENCOUNTER — Inpatient Hospital Stay (HOSPITAL_COMMUNITY)
Admission: EM | Admit: 2021-12-28 | Discharge: 2022-01-01 | DRG: 282 | Discharge status: Against Medical Advice | Payer: Medicare Other | Attending: Cardiology | Admitting: Cardiology

## 2021-12-28 ENCOUNTER — Emergency Department (HOSPITAL_COMMUNITY): Payer: Medicare Other

## 2021-12-28 ENCOUNTER — Other Ambulatory Visit: Payer: Self-pay

## 2021-12-28 ENCOUNTER — Encounter (HOSPITAL_COMMUNITY): Payer: Self-pay | Admitting: Emergency Medicine

## 2021-12-28 DIAGNOSIS — I160 Hypertensive urgency: Secondary | ICD-10-CM | POA: Diagnosis present

## 2021-12-28 DIAGNOSIS — I2510 Atherosclerotic heart disease of native coronary artery without angina pectoris: Secondary | ICD-10-CM | POA: Diagnosis present

## 2021-12-28 DIAGNOSIS — R350 Frequency of micturition: Secondary | ICD-10-CM | POA: Diagnosis present

## 2021-12-28 DIAGNOSIS — F17210 Nicotine dependence, cigarettes, uncomplicated: Secondary | ICD-10-CM | POA: Diagnosis present

## 2021-12-28 DIAGNOSIS — N401 Enlarged prostate with lower urinary tract symptoms: Secondary | ICD-10-CM | POA: Diagnosis present

## 2021-12-28 DIAGNOSIS — Z5900 Homelessness unspecified: Secondary | ICD-10-CM

## 2021-12-28 DIAGNOSIS — F17200 Nicotine dependence, unspecified, uncomplicated: Secondary | ICD-10-CM | POA: Diagnosis not present

## 2021-12-28 DIAGNOSIS — Z5329 Procedure and treatment not carried out because of patient's decision for other reasons: Secondary | ICD-10-CM | POA: Diagnosis not present

## 2021-12-28 DIAGNOSIS — N1831 Chronic kidney disease, stage 3a: Secondary | ICD-10-CM | POA: Diagnosis present

## 2021-12-28 DIAGNOSIS — F1010 Alcohol abuse, uncomplicated: Secondary | ICD-10-CM | POA: Diagnosis present

## 2021-12-28 DIAGNOSIS — N400 Enlarged prostate without lower urinary tract symptoms: Secondary | ICD-10-CM | POA: Diagnosis present

## 2021-12-28 DIAGNOSIS — I129 Hypertensive chronic kidney disease with stage 1 through stage 4 chronic kidney disease, or unspecified chronic kidney disease: Secondary | ICD-10-CM | POA: Diagnosis present

## 2021-12-28 DIAGNOSIS — F1410 Cocaine abuse, uncomplicated: Secondary | ICD-10-CM | POA: Diagnosis present

## 2021-12-28 DIAGNOSIS — E876 Hypokalemia: Secondary | ICD-10-CM | POA: Diagnosis present

## 2021-12-28 DIAGNOSIS — I214 Non-ST elevation (NSTEMI) myocardial infarction: Principal | ICD-10-CM | POA: Diagnosis present

## 2021-12-28 DIAGNOSIS — R079 Chest pain, unspecified: Secondary | ICD-10-CM | POA: Diagnosis present

## 2021-12-28 LAB — URINALYSIS, ROUTINE W REFLEX MICROSCOPIC
Bilirubin Urine: NEGATIVE
Glucose, UA: NEGATIVE mg/dL
Hgb urine dipstick: NEGATIVE
Ketones, ur: NEGATIVE mg/dL
Leukocytes,Ua: NEGATIVE
Nitrite: NEGATIVE
Protein, ur: 30 mg/dL — AB
Specific Gravity, Urine: 1.029 (ref 1.005–1.030)
pH: 5 (ref 5.0–8.0)

## 2021-12-28 LAB — TROPONIN I (HIGH SENSITIVITY)
Troponin I (High Sensitivity): 253 ng/L (ref <18)
Troponin I (High Sensitivity): 403 ng/L (ref <18)

## 2021-12-28 LAB — CBC
HCT: 41.1 % (ref 39.0–52.0)
Hemoglobin: 14.2 g/dL (ref 13.0–17.0)
MCH: 31.8 pg (ref 26.0–34.0)
MCHC: 34.5 g/dL (ref 30.0–36.0)
MCV: 92.2 fL (ref 80.0–100.0)
Platelets: 241 10*3/uL (ref 150–400)
RBC: 4.46 MIL/uL (ref 4.22–5.81)
RDW: 13 % (ref 11.5–15.5)
WBC: 9.1 10*3/uL (ref 4.0–10.5)
nRBC: 0 % (ref 0.0–0.2)

## 2021-12-28 LAB — BASIC METABOLIC PANEL
Anion gap: 9 (ref 5–15)
BUN: 24 mg/dL — ABNORMAL HIGH (ref 8–23)
CO2: 21 mmol/L — ABNORMAL LOW (ref 22–32)
Calcium: 8.9 mg/dL (ref 8.9–10.3)
Chloride: 109 mmol/L (ref 98–111)
Creatinine, Ser: 1.43 mg/dL — ABNORMAL HIGH (ref 0.61–1.24)
GFR, Estimated: 54 mL/min — ABNORMAL LOW (ref >60)
Glucose, Bld: 127 mg/dL — ABNORMAL HIGH (ref 70–99)
Potassium: 3.4 mmol/L — ABNORMAL LOW (ref 3.5–5.1)
Sodium: 139 mmol/L (ref 135–145)

## 2021-12-28 LAB — HEPATIC FUNCTION PANEL
ALT: 12 U/L (ref 0–44)
AST: 21 U/L (ref 15–41)
Albumin: 3.8 g/dL (ref 3.5–5.0)
Alkaline Phosphatase: 76 U/L (ref 38–126)
Bilirubin, Direct: 0.2 mg/dL (ref 0.0–0.2)
Indirect Bilirubin: 0.6 mg/dL (ref 0.3–0.9)
Total Bilirubin: 0.8 mg/dL (ref 0.3–1.2)
Total Protein: 7.4 g/dL (ref 6.5–8.1)

## 2021-12-28 MED ORDER — LISINOPRIL 10 MG PO TABS
10.0000 mg | ORAL_TABLET | Freq: Every day | ORAL | Status: DC
  Administered 2021-12-29 – 2021-12-31 (×3): 10 mg via ORAL
  Filled 2021-12-28 (×3): qty 1

## 2021-12-28 MED ORDER — HEPARIN (PORCINE) 25000 UT/250ML-% IV SOLN
1300.0000 [IU]/h | INTRAVENOUS | Status: DC
  Administered 2021-12-28 – 2021-12-31 (×5): 1300 [IU]/h via INTRAVENOUS
  Filled 2021-12-28 (×5): qty 250

## 2021-12-28 MED ORDER — ACETAMINOPHEN 325 MG PO TABS: 650.0000 mg | ORAL_TABLET | ORAL | Status: DC | PRN

## 2021-12-28 MED ORDER — ASPIRIN 81 MG PO TBEC
81.0000 mg | DELAYED_RELEASE_TABLET | Freq: Every day | ORAL | Status: DC
  Administered 2021-12-29 – 2021-12-31 (×3): 81 mg via ORAL
  Filled 2021-12-28 (×3): qty 1

## 2021-12-28 MED ORDER — NITROGLYCERIN 0.4 MG SL SUBL: 0.4000 mg | SUBLINGUAL_TABLET | SUBLINGUAL | Status: DC | PRN

## 2021-12-28 MED ORDER — HEPARIN BOLUS VIA INFUSION
4000.0000 [IU] | Freq: Once | INTRAVENOUS | Status: AC
  Administered 2021-12-28: 4000 [IU] via INTRAVENOUS
  Filled 2021-12-28: qty 4000

## 2021-12-28 MED ORDER — ONDANSETRON HCL 4 MG/2ML IJ SOLN: 4.0000 mg | Freq: Four times a day (QID) | INTRAMUSCULAR | Status: DC | PRN

## 2021-12-28 MED ORDER — TAMSULOSIN HCL 0.4 MG PO CAPS
0.4000 mg | ORAL_CAPSULE | Freq: Every day | ORAL | Status: DC
  Administered 2021-12-28 – 2021-12-31 (×4): 0.4 mg via ORAL
  Filled 2021-12-28 (×4): qty 1

## 2021-12-28 NOTE — ED Notes (Signed)
Benjamin Pierce would like a call at [phone].

## 2021-12-28 NOTE — ED Provider Notes (Signed)
[facility] EMERGENCY DEPARTMENT Provider Note   CSN: [id number] Arrival date & time: 12/28/21  1446     History  Chief Complaint  Patient presents with  . Chest Pain    Benjamin Pierce is a 66 y.o. male with history of hypertension who presents with 2 episodes of chest pain.  First episode happened around 7 or 8:00 this morning when he was walking on the street.  Described as a grabbing sensation under the left chest.  It was sudden lasted about 5 to 6 minutes.  He sat down until it went away.  Was associated with sweating.  Second episode was similar and happened a couple of hours later when he was sitting.  Does not seem to be associated with certain movements.  Not sure if it is worse with exertion.  After that he presented to the emergency department for evaluation.  At time of evaluation he is chest pain-free.  Review of systems negative for shortness of breath, fainting or near fainting, cough, fevers, abdominal pain, nausea, vomiting, diarrhea.  Medical history is notable for a similar episode several years ago.  He reports being hospitalized for a couple of days but does not remember the outcome of his work-up. Social history notable for cocaine use, as recently as 2 days ago.  Medical history: Hypertension, not on medication Hasn't seen a physician in several years  Family history: No known cardiac history  Social history: Intermittently homeless Cocaine use Tobacco use   Chest Pain Associated symptoms: diaphoresis        Home Medications Prior to Admission medications   Medication Sig Start Date End Date Taking? Authorizing Provider  lisinopril (ZESTRIL) 2.5 MG tablet Take 1 tablet (2.5 mg total) by mouth daily. Patient not taking: Reported on 12/28/2021 10/21/12   Ashok Norris, NP      Allergies    Patient has no known allergies.    Review of Systems   Review of Systems  Constitutional:  Positive for diaphoresis.  Cardiovascular:  Positive  for chest pain.    Physical Exam Updated Vital Signs BP (!) 190/112   Pulse 82   Temp 98.1 F (36.7 C) (Oral)   Resp 20   SpO2 100%  Physical Exam Vitals and nursing note reviewed.  Constitutional:      General: He is not in acute distress.    Appearance: He is well-developed.  HENT:     Head: Normocephalic and atraumatic.  Eyes:     Conjunctiva/sclera: Conjunctivae normal.  Neck:     Vascular: No JVD.  Cardiovascular:     Rate and Rhythm: Normal rate and regular rhythm.     Pulses:          Radial pulses are 2+ on the right side and 2+ on the left side.     Heart sounds: Normal heart sounds. No murmur heard. Pulmonary:     Effort: Pulmonary effort is normal. No tachypnea or respiratory distress.     Breath sounds: Normal breath sounds.  Abdominal:     Palpations: Abdomen is soft.     Tenderness: There is no abdominal tenderness.  Musculoskeletal:        General: No swelling.     Cervical back: Neck supple.     Right lower leg: No edema.     Left lower leg: No edema.  Skin:    General: Skin is warm and dry.     Capillary Refill: Capillary refill takes less than 2  seconds.  Neurological:     Mental Status: He is alert.  Psychiatric:        Mood and Affect: Mood normal.     ED Results / Procedures / Treatments   Labs (all labs ordered are listed, but only abnormal results are displayed) Labs Reviewed  BASIC METABOLIC PANEL - Abnormal; Notable for the following components:      Result Value   Potassium 3.4 (*)    CO2 21 (*)    Glucose, Bld 127 (*)    BUN 24 (*)    Creatinine, Ser 1.43 (*)    GFR, Estimated 54 (*)    All other components within normal limits  URINALYSIS, ROUTINE W REFLEX MICROSCOPIC - Abnormal; Notable for the following components:   Color, Urine AMBER (*)    APPearance HAZY (*)    Protein, ur 30 (*)    Bacteria, UA FEW (*)    All other components within normal limits  TROPONIN I (HIGH SENSITIVITY) - Abnormal; Notable for the following  components:   Troponin I (High Sensitivity) 253 (*)    All other components within normal limits  TROPONIN I (HIGH SENSITIVITY) - Abnormal; Notable for the following components:   Troponin I (High Sensitivity) 403 (*)    All other components within normal limits  TROPONIN I (HIGH SENSITIVITY) - Abnormal; Notable for the following components:   Troponin I (High Sensitivity) 427 (*)    All other components within normal limits  CBC  HEPATIC FUNCTION PANEL  HEPARIN LEVEL (UNFRACTIONATED)  CBC    EKG EKG Interpretation  Date/Time:  Thursday December 28 2021 14:54:21 EDT Ventricular Rate:  78 PR Interval:  170 QRS Duration: 88 QT Interval:  402 QTC Calculation: 458 R Axis:   7 Text Interpretation: Normal sinus rhythm Left ventricular hypertrophy ( R in aVL , Sokolow-Lyon , Cornell product ) new ST & T wave abnormality, consider inferior ischemia When compared with ECG of 24-Jan-2019 23:41, PREVIOUS ECG IS PRESENT Confirmed by Gwyneth Sprout ([id number]) on 12/28/2021 5:35:09 PM  Radiology DG Chest 2 View  Result Date: 12/28/2021 CLINICAL DATA:  Chest pain. EXAM: CHEST - 2 VIEW COMPARISON:  October 17, 2012. FINDINGS: The heart size and mediastinal contours are within normal limits. Both lungs are clear. The visualized skeletal structures are unremarkable. IMPRESSION: No active cardiopulmonary disease. Electronically Signed   By: Lupita Raider M.D.   On: 12/28/2021 15:32    Procedures Procedures  Continuous cardiac monitoring showed normal sinus rhythm.  Medications Ordered in ED Medications  heparin ADULT infusion 100 units/mL (25000 units/223mL) (1,300 Units/hr Intravenous New Bag/Given 12/28/21 1838)  tamsulosin (FLOMAX) capsule 0.4 mg (has no administration in time range)  heparin bolus via infusion 4,000 Units (4,000 Units Intravenous Bolus from Bag 12/28/21 1838)    ED Course/ Medical Decision Making/ A&P Clinical Course as of 12/28/21 2142  Thu Dec 28, 2021  2012 Troponin I  (High Sensitivity)(!!): [id number] [MM]  2140 Case discussed with cardiology who will evaluate the patient for admission for evaluation and treatment of NSTEMI. [MM]    Clinical Course User Index [MM] Marrianne Mood, MD                           Medical Decision Making  Patient is a 66 year old male with history of hypertension and 1 pack/day smoker who presents for evaluation of episodic chest pain concerning for angina based on HPI.  No chest pain at  time of exam.  Hemodynamically stable.  Laboratory work-up notable for troponin to 256.  EKG shows new T wave inversions in lead III, questionable depression in leads II and aVF. Will treat for ACS with heparin at this time. Low index of suspicion for PE given episodic nature of pain. Less likely dissection in absence of back pain, no mediastinal widening, and BP 150s systolic.   Co morbidities that complicate the patient evaluation  Hypertension Cocaine use Tobacco use   Social Determinants of Health:  Cocaine use Homelessness   Additional history obtained:  Additional history and/or information obtained from chart review External records from outside source obtained and reviewed including charts from prior hospital encounters, nuclear stress test from 06/30/2012 that showed anteroseptal ischemia and inferior/inferolateral scar with some ischemia   Lab Tests:  I Ordered (or co-signed), and personally interpreted labs.  The pertinent results include:   Troponin to 253   Imaging Studies ordered:  I ordered (or co-signed) imaging studies including CXR  I independently visualized and interpreted imaging which showed no acute findings to explain patient's current clinical syndrome I agree with the radiologist interpretation   Cardiac Monitoring:  The patient was maintained on a cardiac monitor.  The cardiac monitored showed an rhythm of normal sinus rhythm. The patient was also maintained on pulse oximetry. The readings  were typically within normal limits.   Medicines ordered and prescription drug management:  I ordered medication including heparin IV Reevaluation of the patient after these medicines showed that the patient stayed the same   Consultations Obtained:  I requested consultation with cardiology,  and discussed lab and imaging findings as well as pertinent plan - they recommend: Admission for evaluation and treatment of NSTEMI.   Reevaluation:  After the interventions noted above, I reevaluated the patient and found that they have :stayed the same.    Dispostion:  After consideration of the diagnostic results and the patients response to treatment, I feel that the patent would benefit from admission for evaluation and treatment of NSTEMI.           Final Clinical Impression(s) / ED Diagnoses Final diagnoses:  NSTEMI (non-ST elevated myocardial infarction) [facility])    Rx / DC Orders ED Discharge Orders     None         Marrianne Mood, MD 12/28/21 2142    Gwyneth Sprout, MD 12/29/21 0350

## 2021-12-28 NOTE — Consult Note (Signed)
error 

## 2021-12-28 NOTE — ED Provider Triage Note (Signed)
Emergency Medicine Provider Triage Evaluation Note  ORVILE CORONA , a 66 y.o. male  was evaluated in triage.  Pt complains of chest pain this AM. Patient was diaphoretic in a AC building per EMS. Used crack/cocaine yesterday, not today.   Mild hypertensive otherwise normal. 324mg  of ASA and 1 SL nitro.   Review of Systems  Positive:  Negative:   Physical Exam  There were no vitals taken for this visit. Gen:   Awake, no distress   Resp:  Normal effort  MSK:   Moves extremities without difficulty  Other:  Anterior chest wall tenderness to palpation.   Medical Decision Making  Medically screening exam initiated at 2:48 PM.  Appropriate orders placed.  was informed that the remainder of the evaluation will be completed by another provider, this initial triage assessment does not replace that evaluation, and the importance of remaining in the ED until their evaluation is complete.  Chest pain order set placed.    Ishmael Holter, PA-C 12/28/21 1451
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
## 2021-12-28 NOTE — ED Triage Notes (Signed)
Patient with chest pain since 9 am. Describes it as l sided grabbing pain and movement makes it worse. Per EMS patient was initially diaphoretic upon presentation but all VSS. 324 of ASA given, .4 of NTG.

## 2021-12-29 ENCOUNTER — Inpatient Hospital Stay (HOSPITAL_COMMUNITY): Payer: Medicare Other

## 2021-12-29 DIAGNOSIS — I2510 Atherosclerotic heart disease of native coronary artery without angina pectoris: Secondary | ICD-10-CM | POA: Diagnosis not present

## 2021-12-29 DIAGNOSIS — R079 Chest pain, unspecified: Secondary | ICD-10-CM | POA: Diagnosis not present

## 2021-12-29 DIAGNOSIS — I214 Non-ST elevation (NSTEMI) myocardial infarction: Secondary | ICD-10-CM | POA: Diagnosis not present

## 2021-12-29 LAB — BASIC METABOLIC PANEL
Anion gap: 6 (ref 5–15)
BUN: 21 mg/dL (ref 8–23)
CO2: 28 mmol/L (ref 22–32)
Calcium: 8.4 mg/dL — ABNORMAL LOW (ref 8.9–10.3)
Chloride: 103 mmol/L (ref 98–111)
Creatinine, Ser: 1.44 mg/dL — ABNORMAL HIGH (ref 0.61–1.24)
GFR, Estimated: 54 mL/min — ABNORMAL LOW (ref >60)
Glucose, Bld: 113 mg/dL — ABNORMAL HIGH (ref 70–99)
Potassium: 3.3 mmol/L — ABNORMAL LOW (ref 3.5–5.1)
Sodium: 137 mmol/L (ref 135–145)

## 2021-12-29 LAB — CBC
HCT: 39.3 % (ref 39.0–52.0)
Hemoglobin: 13.3 g/dL (ref 13.0–17.0)
MCH: 31.3 pg (ref 26.0–34.0)
MCHC: 33.8 g/dL (ref 30.0–36.0)
MCV: 92.5 fL (ref 80.0–100.0)
Platelets: 222 10*3/uL (ref 150–400)
RBC: 4.25 MIL/uL (ref 4.22–5.81)
RDW: 13 % (ref 11.5–15.5)
WBC: 7.2 10*3/uL (ref 4.0–10.5)
nRBC: 0 % (ref 0.0–0.2)

## 2021-12-29 LAB — LIPID PANEL
Cholesterol: 124 mg/dL (ref 0–200)
HDL: 36 mg/dL — ABNORMAL LOW (ref >40)
LDL Cholesterol: 79 mg/dL (ref 0–99)
Total CHOL/HDL Ratio: 3.4 RATIO
Triglycerides: 43 mg/dL (ref <150)
VLDL: 9 mg/dL (ref 0–40)

## 2021-12-29 LAB — TROPONIN I (HIGH SENSITIVITY): Troponin I (High Sensitivity): 427 ng/L (ref <18)

## 2021-12-29 LAB — HEPARIN LEVEL (UNFRACTIONATED)
Heparin Unfractionated: 0.44 IU/mL (ref 0.30–0.70)
Heparin Unfractionated: 0.52 IU/mL (ref 0.30–0.70)

## 2021-12-29 LAB — ECHOCARDIOGRAM COMPLETE
Area-P 1/2: 3.6 cm2
Height: 74.5 in
S' Lateral: 3 cm
Weight: 3428.59 oz

## 2021-12-29 LAB — MRSA NEXT GEN BY PCR, NASAL: MRSA by PCR Next Gen: NOT DETECTED

## 2021-12-29 LAB — HIV ANTIBODY (ROUTINE TESTING W REFLEX): HIV Screen 4th Generation wRfx: NONREACTIVE

## 2021-12-29 MED ORDER — IOHEXOL 350 MG/ML SOLN
100.0000 mL | Freq: Once | INTRAVENOUS | Status: AC | PRN
  Administered 2021-12-29: 100 mL via INTRAVENOUS

## 2021-12-29 MED ORDER — ORAL CARE MOUTH RINSE: 15.0000 mL | OROMUCOSAL | Status: DC | PRN

## 2021-12-29 MED ORDER — ZOLPIDEM TARTRATE 5 MG PO TABS
5.0000 mg | ORAL_TABLET | Freq: Once | ORAL | Status: DC
  Filled 2021-12-29: qty 1

## 2021-12-29 MED ORDER — NITROGLYCERIN 0.4 MG SL SUBL
SUBLINGUAL_TABLET | SUBLINGUAL | Status: AC
  Filled 2021-12-29: qty 2

## 2021-12-29 MED ORDER — POTASSIUM CHLORIDE CRYS ER 20 MEQ PO TBCR
40.0000 meq | EXTENDED_RELEASE_TABLET | Freq: Once | ORAL | Status: AC
  Administered 2021-12-29: 40 meq via ORAL
  Filled 2021-12-29: qty 2

## 2021-12-29 MED ORDER — SODIUM CHLORIDE 0.9 % IV SOLN
INTRAVENOUS | Status: AC
Start: 2021-12-29 — End: 2021-12-29

## 2021-12-29 MED ORDER — METOPROLOL TARTRATE 50 MG PO TABS
100.0000 mg | ORAL_TABLET | Freq: Once | ORAL | Status: AC
  Administered 2021-12-29: 100 mg via ORAL
  Filled 2021-12-29: qty 2

## 2021-12-29 MED ORDER — METOPROLOL TARTRATE 50 MG PO TABS: 50.0000 mg | ORAL_TABLET | Freq: Once | ORAL | Status: DC

## 2021-12-29 NOTE — Progress Notes (Signed)
  Echocardiogram 2D Echocardiogram has been performed.  Leta Jungling M 12/29/2021, 9:39 AM
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
## 2021-12-29 NOTE — Progress Notes (Signed)
   CT was suboptimal with moderate plaque in the LAD and diffuse soft plaque.  Unable to do FFR.  Difficult exam because the patient had trouble with breath hold.  Needs cardiac cath.  The patient understands that risks included but are not limited to stroke (1 in 1000), death (1 in 1000), kidney failure [usually temporary] (1 in 500), bleeding (1 in 200), allergic reaction [possibly serious] (1 in 200).  The patient understands and agrees to proceed.   He is on the board for Monday.

## 2021-12-29 NOTE — Progress Notes (Signed)
Progress Note  Patient Name: JAKEOB TULLIS Date of Encounter: 12/29/2021  Primary Cardiologist:   None   Subjective   No further chest pain  Inpatient Medications    Scheduled Meds:  aspirin EC  81 mg Oral Daily   lisinopril  10 mg Oral Daily   tamsulosin  0.4 mg Oral Daily   Continuous Infusions:  heparin 1,300 Units/hr (12/28/21 2216)   PRN Meds: acetaminophen, nitroGLYCERIN, ondansetron (ZOFRAN) IV, mouth rinse   Vital Signs    Vitals:   12/28/21 2130 12/28/21 2223 12/28/21 2314 12/29/21 0408  BP: (!) 139/93 (!) 149/93 (!) 155/98 (!) 145/83  Pulse: 84 81 70 77  Resp: 16 20 20 20   Temp:  97.8 F (36.6 C) 98.2 F (36.8 C) 98.3 F (36.8 C)  TempSrc:  Oral Oral Oral  SpO2: 94% 100% 98% 98%  Weight:   97.2 kg   Height:   6' 2.5" (1.892 m)     Intake/Output Summary (Last 24 hours) at 12/29/2021 0740 Last data filed at 12/29/2021 0350 Gross per 24 hour  Intake 158.91 ml  Output --  Net 158.91 ml   Filed Weights   12/28/21 2314  Weight: 97.2 kg    Telemetry    NSR - Personally Reviewed  ECG    NA - Personally Reviewed  Physical Exam   GEN: No acute distress.   Neck: No  JVD Cardiac: RRR, no murmurs, rubs, or gallops.  Respiratory: Clear  to auscultation bilaterally. GI: Soft, nontender, non-distended  MS: No  edema; No deformity. Neuro:  Nonfocal  Psych: Normal affect   Labs    Chemistry Recent Labs  Lab 12/28/21 1521 12/29/21 0037  NA 139 137  K 3.4* 3.3*  CL 109 103  CO2 21* 28  GLUCOSE 127* 113*  BUN 24* 21  CREATININE 1.43* 1.44*  CALCIUM 8.9 8.4*  PROT 7.4  --   ALBUMIN 3.8  --   AST 21  --   ALT 12  --   ALKPHOS 76  --   BILITOT 0.8  --   GFRNONAA 54* 54*  ANIONGAP 9 6     Hematology Recent Labs  Lab 12/28/21 1521 12/29/21 0037  WBC 9.1 7.2  RBC 4.46 4.25  HGB 14.2 13.3  HCT 41.1 39.3  MCV 92.2 92.5  MCH 31.8 31.3  MCHC 34.5 33.8  RDW 13.0 13.0  PLT 241 222    Cardiac EnzymesNo results for input(s):  "TROPONINI" in the last 168 hours. No results for input(s): "TROPIPOC" in the last 168 hours.   BNPNo results for input(s): "BNP", "PROBNP" in the last 168 hours.   DDimer No results for input(s): "DDIMER" in the last 168 hours.   Radiology    DG Chest 2 View  Result Date: 12/28/2021 CLINICAL DATA:  Chest pain. EXAM: CHEST - 2 VIEW COMPARISON:  October 17, 2012. FINDINGS: The heart size and mediastinal contours are within normal limits. Both lungs are clear. The visualized skeletal structures are unremarkable. IMPRESSION: No active cardiopulmonary disease. Electronically Signed   By: October 19, 2012 M.D.   On: 12/28/2021 15:32    Cardiac Studies   Echo pending.    Patient Profile     66 y.o. male with a history of uncontrolled hypertension, active cocaine use, tobacco use disorder, CKD 3A who is being seen 12/28/2021 for the evaluation of chest pain.  Assessment & Plan    NSTEMI:     Continue heparin, ASA.  Check echo.  Plan coronary CTA  Hypertensive urgency:  Started lisinopril.    BP improved   Cocaine use disorder:  Counseled on cessation .   Reports no cocaine x 5 days.  OK for beta blocker per protocol.     CKD 3A:  He has symptoms consistent with BPH.    Creat is mildly elevated.  Follow. I think that he can tolerate contrast.  Plan to hydrate this morning.  Regardless of his CT result I would keep in hospital to follow creat in AM and potassium.    Tobacco use disorder:  Counseled on cessation  Hypokalemia:  Supplement.    For questions or updates, please contact [facility] Please consult [email or web address] for contact info under Cardiology/STEMI.   Signed, Rollene Rotunda, MD  12/29/2021, 7:40 AM

## 2021-12-29 NOTE — Progress Notes (Signed)
Ultrasound to lower FA - nothing visualized with Ultrasound for # 18 for CTA angio placed # 18 just above A/C on Lt upper arm. Pt tolerated procedure well.
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
## 2021-12-29 NOTE — TOC Transition Note (Signed)
Transition of Care [facility]) - CM/SW Discharge Note   Patient Details  Name: CADARIUS NEVARES MRN: [id number] Date of Birth: 1955-10-29  Transition of Care [facility]) CM/SW Contact:  Beckie Busing, RN Phone Number:[phone]  12/29/2021, 12:18 PM   Clinical Narrative:     Transition of Care [facility]) Screening Note   Patient Details  Name: ADELFO DIEBEL Date of Birth: 09-25-1955   Transition of Care [facility]) CM/SW Contact:    Beckie Busing, RN Phone Number: 12/29/2021, 12:18 PM    Transition of Care Department [facility]) has reviewed patient and no TOC needs have been identified at this time. We will continue to monitor patient advancement through interdisciplinary progression rounds.            Patient Goals and CMS Choice        Discharge Placement                       Discharge Plan and Services                                     Social Determinants of Health (SDOH) Interventions     Readmission Risk Interventions     No data to display

## 2021-12-30 DIAGNOSIS — R079 Chest pain, unspecified: Secondary | ICD-10-CM | POA: Diagnosis not present

## 2021-12-30 DIAGNOSIS — I214 Non-ST elevation (NSTEMI) myocardial infarction: Secondary | ICD-10-CM | POA: Diagnosis not present

## 2021-12-30 LAB — CBC
HCT: 36 % — ABNORMAL LOW (ref 39.0–52.0)
Hemoglobin: 12.3 g/dL — ABNORMAL LOW (ref 13.0–17.0)
MCH: 31.4 pg (ref 26.0–34.0)
MCHC: 34.2 g/dL (ref 30.0–36.0)
MCV: 91.8 fL (ref 80.0–100.0)
Platelets: 201 10*3/uL (ref 150–400)
RBC: 3.92 MIL/uL — ABNORMAL LOW (ref 4.22–5.81)
RDW: 13 % (ref 11.5–15.5)
WBC: 7.6 10*3/uL (ref 4.0–10.5)
nRBC: 0 % (ref 0.0–0.2)

## 2021-12-30 LAB — HEPARIN LEVEL (UNFRACTIONATED): Heparin Unfractionated: 0.43 IU/mL (ref 0.30–0.70)

## 2021-12-30 MED ORDER — HYDRALAZINE HCL 10 MG PO TABS
10.0000 mg | ORAL_TABLET | Freq: Once | ORAL | Status: AC
  Administered 2021-12-30: 10 mg via ORAL
  Filled 2021-12-30: qty 1

## 2021-12-30 MED ORDER — SPIRONOLACTONE 25 MG PO TABS
25.0000 mg | ORAL_TABLET | Freq: Every day | ORAL | Status: DC
  Administered 2021-12-30 – 2022-01-01 (×3): 25 mg via ORAL
  Filled 2021-12-30 (×3): qty 1

## 2021-12-30 NOTE — Progress Notes (Signed)
Progress Note  Patient Name: KALYN AMIRIAN Date of Encounter: 12/30/2021  [facility] Cardiologist: Minus Breeding, MD    Subjective    66 yo admitted with a non-ST segment elevation myocardial infarction.  Coronary CTA was difficult to interpret.  He is scheduled for heart catheterization on Monday.  Has a history of hypertension.  He is currently on lisinopril. History of cocaine use.  States that he has not used any cocaine in 5 days.   Inpatient Medications    Scheduled Meds:  aspirin EC  81 mg Oral Daily   lisinopril  10 mg Oral Daily   tamsulosin  0.4 mg Oral Daily   zolpidem  5 mg Oral Once   Continuous Infusions:  heparin 1,300 Units/hr (12/30/21 0425)   PRN Meds: acetaminophen, nitroGLYCERIN, ondansetron (ZOFRAN) IV, mouth rinse   Vital Signs    Vitals:   12/29/21 1949 12/29/21 2339 12/30/21 0409 12/30/21 0733  BP: (!) 142/99 91/69 135/71 134/73  Pulse: (!) 52 79 60 (!) 56  Resp: 19 18 19 16   Temp: 98.6 F (37 C) 97.9 F (36.6 C) 98.7 F (37.1 C) 98 F (36.7 C)  TempSrc: Oral Oral Oral Oral  SpO2:  97% 98% 99%  Weight:      Height:        Intake/Output Summary (Last 24 hours) at 12/30/2021 0934 Last data filed at 12/30/2021 0734 Gross per 24 hour  Intake 200 ml  Output 1325 ml  Net -1125 ml      12/28/2021   11:14 PM 10/17/2012    4:25 AM 07/01/2012    4:58 AM  Last 3 Weights  Weight (lbs) 214 lb 4.6 oz 208 lb 209 lb 1.6 oz  Weight (kg) 97.2 kg 94.348 kg 94.847 kg      Telemetry    NSR  - Personally Reviewed  ECG     - Personally Reviewed  Physical Exam   GEN: No acute distress.   Neck: No JVD Cardiac: RRR, no murmurs, rubs, or gallops.  Respiratory: Clear to auscultation bilaterally. GI: Soft, nontender, non-distended  MS: No edema; No deformity. Neuro:  Nonfocal  Psych: Normal affect   Labs    High Sensitivity Troponin:   Recent Labs  Lab 12/28/21 1521 12/28/21 1858 12/28/21 2025  TROPONINIHS 253* 403* 427*      Chemistry Recent Labs  Lab 12/28/21 1521 12/29/21 0037  NA 139 137  K 3.4* 3.3*  CL 109 103  CO2 21* 28  GLUCOSE 127* 113*  BUN 24* 21  CREATININE 1.43* 1.44*  CALCIUM 8.9 8.4*  PROT 7.4  --   ALBUMIN 3.8  --   AST 21  --   ALT 12  --   ALKPHOS 76  --   BILITOT 0.8  --   GFRNONAA 54* 54*  ANIONGAP 9 6    Lipids  Recent Labs  Lab 12/29/21 0037  CHOL 124  TRIG 43  HDL 36*  LDLCALC 79  CHOLHDL 3.4    Hematology Recent Labs  Lab 12/28/21 1521 12/29/21 0037 12/30/21 0034  WBC 9.1 7.2 7.6  RBC 4.46 4.25 3.92*  HGB 14.2 13.3 12.3*  HCT 41.1 39.3 36.0*  MCV 92.2 92.5 91.8  MCH 31.8 31.3 31.4  MCHC 34.5 33.8 34.2  RDW 13.0 13.0 13.0  PLT 241 222 201   Thyroid No results for input(s): "TSH", "FREET4" in the last 168 hours.  BNPNo results for input(s): "BNP", "PROBNP" in the last 168 hours.  DDimer No results for input(s): "DDIMER" in the last 168 hours.   Radiology    CT CORONARY MORPH W/CTA COR W/SCORE W/CA W/CM &/OR WO/CM  Addendum Date: 12/29/2021   ADDENDUM REPORT: 12/29/2021 16:22 EXAM: OVER-READ INTERPRETATION  CT CHEST The following report is an over-read performed by radiologist Dr. Schuyler Amor [facility] on 12/29/2021. This over-read does not include interpretation of cardiac or coronary anatomy or pathology. The coronary CTA interpretation by the cardiologist is attached. COMPARISON:  None. FINDINGS: Vascular: No acute abnormality. Mediastinum/nodes: No mass identified. 1.3 cm right hilar lymph node identified. No mediastinal mass or adenopathy noted. Lungs/pleura: No pleural effusion, airspace consolidation, atelectasis or pneumothorax. No suspicious lung nodules. Upper abdomen: No acute abnormality. Musculoskeletal: No acute or suspicious osseous findings. IMPRESSION: 1. No active cardiopulmonary abnormalities. 2. Mildly enlarged right hilar lymph node noted. Nonspecific. Consider follow-up imaging in 3 months with contrast enhanced CT of  the chest. Electronically Signed   By: Signa Kell M.D.   On: 12/29/2021 16:22   Result Date: 12/29/2021 CLINICAL DATA:  Chest pain EXAM: Cardiac CTA MEDICATIONS: Sub lingual nitro. 4mg  and lopressor 50mg  TECHNIQUE: The patient was scanned on a Siemens Force 192 slice scanner. Gantry rotation speed was 250 msecs. Collimation was .6 mm. A 100 kV prospective scan was triggered in the ascending thoracic aorta at 140 HU's Full mA was used between 35% and 75% of the R-R interval. Average HR during the scan was 61 bpm. The 3D data set was interpreted on a dedicated work station using MPR, MIP and VRT modes. A total of 80 cc of contrast was used. FINDINGS: Non-cardiac: See separate report from [facility]. No significant findings on limited lung and soft tissue windows. Calcium Score: Calcium noted in LM, LCX and LAD LM 56 LAD 250 LCX 0.61 RCA 0 Total 307 Coronary Arteries: Right dominant with no anomalies LM: 1-24% calcified plaque LAD: 1-24% calcified plaque in proximal vessel at ostium and take off of D1 Mid vessel after D1 has two mixed plaques with large soft plaque component 50-69% stenosis D1: Normal D2: Normal Circumflex: 1-24% calcified plaque in mid vessel OM1: Normal OM2: Normal RCA: Normal PDA: Normal PLA: Normal IMPRESSION: 1. Calcium score 307 which is 89 th percentile for age/sex 2.  Normal ascending thoracic aorta 3.5 cm 3. CAD RADS 3 Area of concern in mid LAD with 50-69% mixed plaque with large soft component Patient scanned twice with inability to hold breath and motion Study not suitable for FFR CT Would consider heart catheterization Electronically Signed: By: [facility] M.D. On: 12/29/2021 15:28   ECHOCARDIOGRAM COMPLETE  Result Date: 12/29/2021    ECHOCARDIOGRAM REPORT   Patient Name:   GUERIN LASHOMB Date of Exam: 12/29/2021 Medical Rec #:  Ishmael Holter     Height:       74.5 in Accession #:    12/31/2021    Weight:       214.3 lb Date of Birth:  04/11/1956      BSA:           2.248 m Patient Age:    66 years      BP:           138/86 mmHg Patient Gender: M             HR:           50 bpm. Exam Location:  Inpatient Procedure: 2D Echo, Cardiac Doppler and Color Doppler Indications:  NSTEMI I21.4  History:        Patient has prior history of Echocardiogram examinations, most                 recent 06/29/2012. Arrythmias:Bradycardia; Risk                 Factors:Hypertension and Current Smoker.  Sonographer:    Darlina Sicilian RDCS Referring Phys: [id number] [place]  Sonographer Comments: No suprasternal images, exam ended per patients request. IMPRESSIONS  1. Left ventricular ejection fraction, by estimation, is 60 to 65%. The left ventricle has normal function. The left ventricle has no regional wall motion abnormalities. There is moderate left ventricular hypertrophy. Left ventricular diastolic parameters are indeterminate.  2. Right ventricular systolic function is normal. The right ventricular size is normal. Tricuspid regurgitation signal is inadequate for assessing PA pressure.  3. The mitral valve is normal in structure. No evidence of mitral valve regurgitation.  4. The aortic valve is tricuspid. Aortic valve regurgitation is not visualized.  5. There is borderline dilatation of the aortic root, measuring 38 mm.  6. The inferior vena cava is normal in size with greater than 50% respiratory variability, suggesting right atrial pressure of 3 mmHg. Comparison(s): No prior Echocardiogram. Conclusion(s)/Recommendation(s): Normal biventricular function without evidence of hemodynamically significant valvular heart disease. FINDINGS  Left Ventricle: Left ventricular ejection fraction, by estimation, is 60 to 65%. The left ventricle has normal function. The left ventricle has no regional wall motion abnormalities. The left ventricular internal cavity size was normal in size. There is  moderate left ventricular hypertrophy. Left ventricular diastolic parameters are indeterminate.  Right Ventricle: The right ventricular size is normal. Right vetricular wall thickness was not well visualized. Right ventricular systolic function is normal. Tricuspid regurgitation signal is inadequate for assessing PA pressure. Left Atrium: Left atrial size was normal in size. Right Atrium: Right atrial size was normal in size. Pericardium: There is no evidence of pericardial effusion. Mitral Valve: The mitral valve is normal in structure. No evidence of mitral valve regurgitation. Tricuspid Valve: The tricuspid valve is normal in structure. Tricuspid valve regurgitation is trivial. Aortic Valve: The aortic valve is tricuspid. Aortic valve regurgitation is not visualized. Pulmonic Valve: The pulmonic valve was normal in structure. Pulmonic valve regurgitation is not visualized. Aorta: There is borderline dilatation of the aortic root, measuring 38 mm. Venous: The inferior vena cava is normal in size with greater than 50% respiratory variability, suggesting right atrial pressure of 3 mmHg. IAS/Shunts: No atrial level shunt detected by color flow Doppler.  LEFT VENTRICLE PLAX 2D LVIDd:         4.60 cm   Diastology LVIDs:         3.00 cm   LV e' medial:    6.32 cm/s LV PW:         1.15 cm   LV E/e' medial:  10.1 LV IVS:        1.30 cm   LV e' lateral:   8.19 cm/s LVOT diam:     2.50 cm   LV E/e' lateral: 7.8 LV SV:         84 LV SV Index:   37 LVOT Area:     4.91 cm  RIGHT VENTRICLE RV S prime:     10.10 cm/s TAPSE (M-mode): 1.8 cm LEFT ATRIUM             Index        RIGHT ATRIUM  Index LA diam:        4.50 cm 2.00 cm/m   RA Area:     12.20 cm LA Vol (A2C):   58.9 ml 26.20 ml/m  RA Volume:   22.90 ml  10.18 ml/m LA Vol (A4C):   66.0 ml 29.35 ml/m LA Biplane Vol: 63.6 ml 28.29 ml/m  AORTIC VALVE LVOT Vmax:   82.20 cm/s LVOT Vmean:  58.100 cm/s LVOT VTI:    0.171 m  AORTA Ao Root diam: 3.80 cm Ao Asc diam:  3.70 cm MITRAL VALVE MV Area (PHT): 3.60 cm    SHUNTS MV Decel Time: 211 msec    Systemic VTI:   0.17 m MV E velocity: 63.80 cm/s  Systemic Diam: 2.50 cm MV A velocity: 50.10 cm/s MV E/A ratio:  1.27 Carolan Clines Electronically signed by Carolan Clines Signature Date/Time: 12/29/2021/10:52:25 AM    Final    DG Chest 2 View  Result Date: 12/28/2021 CLINICAL DATA:  Chest pain. EXAM: CHEST - 2 VIEW COMPARISON:  October 17, 2012. FINDINGS: The heart size and mediastinal contours are within normal limits. Both lungs are clear. The visualized skeletal structures are unremarkable. IMPRESSION: No active cardiopulmonary disease. Electronically Signed   By: Lupita Raider M.D.   On: 12/28/2021 15:32    Cardiac Studies     Patient Profile      66 yo with presumed CAD,  presented with NSTEMI .  Scheduled for cath Monday   Assessment & Plan   Presumed coronary artery disease: Patient presented with a non-ST segment elevation myocardial infarction.  Troponins peaked at 427.  No further troponin levels were drawn.  He is currently not having any chest pain.  2.  Hypertensive urgency: Blood pressure is well controlled at this point.  134/73 this morning. Potassium is consistently low Add spironolactone 25 mg a day    3.  History of cocaine abuse/alcohol abuse  4.  CKD stage III.  I think that he will tolerate the contrast well.  We will hydrate him prior to his cath.    For questions or updates, please contact [facility] Please consult [email or web address] for contact info under        Signed, Kristeen Miss, MD  12/30/2021, 9:34 AM   d

## 2021-12-30 NOTE — Progress Notes (Signed)
Patient blood pressure 209/115 map 142 after several readings. Patient resting comfortably and not complaining of any pain or headache.
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
## 2021-12-31 DIAGNOSIS — E876 Hypokalemia: Secondary | ICD-10-CM

## 2021-12-31 DIAGNOSIS — I160 Hypertensive urgency: Secondary | ICD-10-CM | POA: Diagnosis not present

## 2021-12-31 DIAGNOSIS — F1410 Cocaine abuse, uncomplicated: Secondary | ICD-10-CM

## 2021-12-31 DIAGNOSIS — I214 Non-ST elevation (NSTEMI) myocardial infarction: Secondary | ICD-10-CM | POA: Diagnosis not present

## 2021-12-31 DIAGNOSIS — R079 Chest pain, unspecified: Secondary | ICD-10-CM | POA: Diagnosis not present

## 2021-12-31 LAB — BASIC METABOLIC PANEL
Anion gap: 7 (ref 5–15)
BUN: 11 mg/dL (ref 8–23)
CO2: 19 mmol/L — ABNORMAL LOW (ref 22–32)
Calcium: 8 mg/dL — ABNORMAL LOW (ref 8.9–10.3)
Chloride: 110 mmol/L (ref 98–111)
Creatinine, Ser: 1.23 mg/dL (ref 0.61–1.24)
GFR, Estimated: >60 mL/min (ref >60)
Glucose, Bld: 104 mg/dL — ABNORMAL HIGH (ref 70–99)
Potassium: 3.3 mmol/L — ABNORMAL LOW (ref 3.5–5.1)
Sodium: 136 mmol/L (ref 135–145)

## 2021-12-31 LAB — CBC
HCT: 37 % — ABNORMAL LOW (ref 39.0–52.0)
Hemoglobin: 12.6 g/dL — ABNORMAL LOW (ref 13.0–17.0)
MCH: 31.4 pg (ref 26.0–34.0)
MCHC: 34.1 g/dL (ref 30.0–36.0)
MCV: 92.3 fL (ref 80.0–100.0)
Platelets: 182 10*3/uL (ref 150–400)
RBC: 4.01 MIL/uL — ABNORMAL LOW (ref 4.22–5.81)
RDW: 12.7 % (ref 11.5–15.5)
WBC: 6.8 10*3/uL (ref 4.0–10.5)
nRBC: 0 % (ref 0.0–0.2)

## 2021-12-31 LAB — HEPARIN LEVEL (UNFRACTIONATED): Heparin Unfractionated: 0.47 IU/mL (ref 0.30–0.70)

## 2021-12-31 MED ORDER — POTASSIUM CHLORIDE CRYS ER 20 MEQ PO TBCR
40.0000 meq | EXTENDED_RELEASE_TABLET | Freq: Once | ORAL | Status: AC
  Administered 2021-12-31: 40 meq via ORAL
  Filled 2021-12-31: qty 2

## 2021-12-31 MED ORDER — HYDRALAZINE HCL 25 MG PO TABS: 25.0000 mg | ORAL_TABLET | Freq: Four times a day (QID) | ORAL | Status: DC | PRN

## 2021-12-31 NOTE — Progress Notes (Signed)
Progress Note  Patient Name: Benjamin Pierce Date of Encounter: 12/31/2021  [facility] Cardiologist: Rollene Rotunda, MD    Subjective    66 yo admitted with a non-ST segment elevation myocardial infarction.  Coronary CTA was difficult to interpret.  He is scheduled for heart catheterization on Monday.  Has a history of hypertension.  He is currently on lisinopril. History of cocaine use.  States that he has not used any cocaine in 5 days.  BP was  elevated overnight .  BP increases when he gets agitated.  Its back to normal this am   We started spironolactone yesterday  Has diuresed 3.2 liters so far this admission Potassium is still low ( 3.3)   Inpatient Medications    Scheduled Meds:  aspirin EC  81 mg Oral Daily   lisinopril  10 mg Oral Daily   spironolactone  25 mg Oral Daily   tamsulosin  0.4 mg Oral Daily   zolpidem  5 mg Oral Once   Continuous Infusions:  heparin 1,300 Units/hr (12/30/21 2302)   PRN Meds: acetaminophen, nitroGLYCERIN, ondansetron (ZOFRAN) IV, mouth rinse   Vital Signs    Vitals:   12/30/21 2218 12/30/21 2340 12/31/21 0400 12/31/21 0755  BP: (!) 209/115 (!) 144/57 (!) 157/92 117/71  Pulse:  67 69   Resp:  11 16   Temp:  98.7 F (37.1 C) 98.4 F (36.9 C)   TempSrc:  Oral Oral   SpO2:  96% 94%   Weight:      Height:        Intake/Output Summary (Last 24 hours) at 12/31/2021 0916 Last data filed at 12/31/2021 0420 Gross per 24 hour  Intake 600 ml  Output 2450 ml  Net -1850 ml       12/28/2021   11:14 PM 10/17/2012    4:25 AM 07/01/2012    4:58 AM  Last 3 Weights  Weight (lbs) 214 lb 4.6 oz 208 lb 209 lb 1.6 oz  Weight (kg) 97.2 kg 94.348 kg 94.847 kg      Telemetry    NSR  - Personally Reviewed  ECG     - Personally Reviewed  Physical Exam   Physical Exam: Blood pressure 117/71, pulse 69, temperature 98.4 F (36.9 C), temperature source Oral, resp. rate 16, height 6' 2.5" (1.892 m), weight 97.2 kg, SpO2 94  %.  GEN:  middle age male,  HEENT: Normal NECK: No JVD; No carotid bruits LYMPHATICS: No lymphadenopathy CARDIAC: RRR , no murmurs, rubs, gallops RESPIRATORY:  Clear to auscultation without rales, wheezing or rhonchi  ABDOMEN: Soft, non-tender, non-distended MUSCULOSKELETAL:  No edema; No deformity  SKIN: Warm and dry NEUROLOGIC:  somnolent   Labs    High Sensitivity Troponin:   Recent Labs  Lab 12/28/21 1521 12/28/21 1858 12/28/21 2025  TROPONINIHS 253* 403* 427*      Chemistry Recent Labs  Lab 12/28/21 1521 12/29/21 0037 12/31/21 0122  NA 139 137 136  K 3.4* 3.3* 3.3*  CL 109 103 110  CO2 21* 28 19*  GLUCOSE 127* 113* 104*  BUN 24* 21 11  CREATININE 1.43* 1.44* 1.23  CALCIUM 8.9 8.4* 8.0*  PROT 7.4  --   --   ALBUMIN 3.8  --   --   AST 21  --   --   ALT 12  --   --   ALKPHOS 76  --   --   BILITOT 0.8  --   --   [facility]  54* 54* >60  ANIONGAP 9 6 7      Lipids  Recent Labs  Lab 12/29/21 0037  CHOL 124  TRIG 43  HDL 36*  LDLCALC 79  CHOLHDL 3.4     Hematology Recent Labs  Lab 12/29/21 0037 12/30/21 0034 12/31/21 0122  WBC 7.2 7.6 6.8  RBC 4.25 3.92* 4.01*  HGB 13.3 12.3* 12.6*  HCT 39.3 36.0* 37.0*  MCV 92.5 91.8 92.3  MCH 31.3 31.4 31.4  MCHC 33.8 34.2 34.1  RDW 13.0 13.0 12.7  PLT 222 201 182    Thyroid No results for input(s): "TSH", "FREET4" in the last 168 hours.  BNPNo results for input(s): "BNP", "PROBNP" in the last 168 hours.  DDimer No results for input(s): "DDIMER" in the last 168 hours.   Radiology    CT CORONARY MORPH W/CTA COR W/SCORE W/CA W/CM &/OR WO/CM  Addendum Date: 12/29/2021   ADDENDUM REPORT: 12/29/2021 16:22 EXAM: OVER-READ INTERPRETATION  CT CHEST The following report is an over-read performed by radiologist Dr. 12/31/2021 [facility] on 12/29/2021. This over-read does not include interpretation of cardiac or coronary anatomy or pathology. The coronary CTA interpretation by the cardiologist is  attached. COMPARISON:  None. FINDINGS: Vascular: No acute abnormality. Mediastinum/nodes: No mass identified. 1.3 cm right hilar lymph node identified. No mediastinal mass or adenopathy noted. Lungs/pleura: No pleural effusion, airspace consolidation, atelectasis or pneumothorax. No suspicious lung nodules. Upper abdomen: No acute abnormality. Musculoskeletal: No acute or suspicious osseous findings. IMPRESSION: 1. No active cardiopulmonary abnormalities. 2. Mildly enlarged right hilar lymph node noted. Nonspecific. Consider follow-up imaging in 3 months with contrast enhanced CT of the chest. Electronically Signed   By: 12/31/2021 M.D.   On: 12/29/2021 16:22   Result Date: 12/29/2021 CLINICAL DATA:  Chest pain EXAM: Cardiac CTA MEDICATIONS: Sub lingual nitro. 4mg  and lopressor 50mg  TECHNIQUE: The patient was scanned on a Siemens Force 192 slice scanner. Gantry rotation speed was 250 msecs. Collimation was .6 mm. A 100 kV prospective scan was triggered in the ascending thoracic aorta at 140 HU's Full mA was used between 35% and 75% of the R-R interval. Average HR during the scan was 61 bpm. The 3D data set was interpreted on a dedicated work station using MPR, MIP and VRT modes. A total of 80 cc of contrast was used. FINDINGS: Non-cardiac: See separate report from [facility]. No significant findings on limited lung and soft tissue windows. Calcium Score: Calcium noted in LM, LCX and LAD LM 56 LAD 250 LCX 0.61 RCA 0 Total 307 Coronary Arteries: Right dominant with no anomalies LM: 1-24% calcified plaque LAD: 1-24% calcified plaque in proximal vessel at ostium and take off of D1 Mid vessel after D1 has two mixed plaques with large soft plaque component 50-69% stenosis D1: Normal D2: Normal Circumflex: 1-24% calcified plaque in mid vessel OM1: Normal OM2: Normal RCA: Normal PDA: Normal PLA: Normal IMPRESSION: 1. Calcium score 307 which is 89 th percentile for age/sex 2.  Normal ascending thoracic aorta  3.5 cm 3. CAD RADS 3 Area of concern in mid LAD with 50-69% mixed plaque with large soft component Patient scanned twice with inability to hold breath and motion Study not suitable for FFR CT Would consider heart catheterization Electronically Signed: By: M.D. On: 12/29/2021 15:28   ECHOCARDIOGRAM COMPLETE  Result Date: 12/29/2021    ECHOCARDIOGRAM REPORT   Patient Name:   SYDNEY AZURE Date of Exam: 12/29/2021 Medical Rec #:  [id number]     Height:       74.5 in Accession #:    [id number]    Weight:       214.3 lb Date of Birth:  [birth year]      BSA:          2.248 m Patient Age:    16 years      BP:           138/86 mmHg Patient Gender: M             HR:           50 bpm. Exam Location:  Inpatient Procedure: 2D Echo, Cardiac Doppler and Color Doppler Indications:    NSTEMI I21.4  History:        Patient has prior history of Echocardiogram examinations, most                 recent 06/29/2012. Arrythmias:Bradycardia; Risk                 Factors:Hypertension and Current Smoker.  Sonographer:    Darlina Sicilian RDCS Referring Phys: [id number] [place]  Sonographer Comments: No suprasternal images, exam ended per patients request. IMPRESSIONS  1. Left ventricular ejection fraction, by estimation, is 60 to 65%. The left ventricle has normal function. The left ventricle has no regional wall motion abnormalities. There is moderate left ventricular hypertrophy. Left ventricular diastolic parameters are indeterminate.  2. Right ventricular systolic function is normal. The right ventricular size is normal. Tricuspid regurgitation signal is inadequate for assessing PA pressure.  3. The mitral valve is normal in structure. No evidence of mitral valve regurgitation.  4. The aortic valve is tricuspid. Aortic valve regurgitation is not visualized.  5. There is borderline dilatation of the aortic root, measuring 38 mm.  6. The inferior vena cava is normal in size with greater than 50% respiratory  variability, suggesting right atrial pressure of 3 mmHg. Comparison(s): No prior Echocardiogram. Conclusion(s)/Recommendation(s): Normal biventricular function without evidence of hemodynamically significant valvular heart disease. FINDINGS  Left Ventricle: Left ventricular ejection fraction, by estimation, is 60 to 65%. The left ventricle has normal function. The left ventricle has no regional wall motion abnormalities. The left ventricular internal cavity size was normal in size. There is  moderate left ventricular hypertrophy. Left ventricular diastolic parameters are indeterminate. Right Ventricle: The right ventricular size is normal. Right vetricular wall thickness was not well visualized. Right ventricular systolic function is normal. Tricuspid regurgitation signal is inadequate for assessing PA pressure. Left Atrium: Left atrial size was normal in size. Right Atrium: Right atrial size was normal in size. Pericardium: There is no evidence of pericardial effusion. Mitral Valve: The mitral valve is normal in structure. No evidence of mitral valve regurgitation. Tricuspid Valve: The tricuspid valve is normal in structure. Tricuspid valve regurgitation is trivial. Aortic Valve: The aortic valve is tricuspid. Aortic valve regurgitation is not visualized. Pulmonic Valve: The pulmonic valve was normal in structure. Pulmonic valve regurgitation is not visualized. Aorta: There is borderline dilatation of the aortic root, measuring 38 mm. Venous: The inferior vena cava is normal in size with greater than 50% respiratory variability, suggesting right atrial pressure of 3 mmHg. IAS/Shunts: No atrial level shunt detected by color flow Doppler.  LEFT VENTRICLE PLAX 2D LVIDd:         4.60 cm   Diastology LVIDs:         3.00 cm   LV e' medial:    6.32  cm/s LV PW:         1.15 cm   LV E/e' medial:  10.1 LV IVS:        1.30 cm   LV e' lateral:   8.19 cm/s LVOT diam:     2.50 cm   LV E/e' lateral: 7.8 LV SV:         84 LV SV  Index:   37 LVOT Area:     4.91 cm  RIGHT VENTRICLE RV S prime:     10.10 cm/s TAPSE (M-mode): 1.8 cm LEFT ATRIUM             Index        RIGHT ATRIUM           Index LA diam:        4.50 cm 2.00 cm/m   RA Area:     12.20 cm LA Vol (A2C):   58.9 ml 26.20 ml/m  RA Volume:   22.90 ml  10.18 ml/m LA Vol (A4C):   66.0 ml 29.35 ml/m LA Biplane Vol: 63.6 ml 28.29 ml/m  AORTIC VALVE LVOT Vmax:   82.20 cm/s LVOT Vmean:  58.100 cm/s LVOT VTI:    0.171 m  AORTA Ao Root diam: 3.80 cm Ao Asc diam:  3.70 cm MITRAL VALVE MV Area (PHT): 3.60 cm    SHUNTS MV Decel Time: 211 msec    Systemic VTI:  0.17 m MV E velocity: 63.80 cm/s  Systemic Diam: 2.50 cm MV A velocity: 50.10 cm/s MV E/A ratio:  1.27 Landscape architect signed by Phineas Inches Signature Date/Time: 12/29/2021/10:52:25 AM    Final     Cardiac Studies     Patient Profile      66 yo with presumed CAD,  presented with NSTEMI .  Scheduled for cath Monday   Assessment & Plan   Presumed coronary artery disease: Patient presented with a non-ST segment elevation myocardial infarction.  Troponins peaked at 427.  No further troponin levels were drawn.  He is currently not having any chest pain.  He is scheduled for heart catheterization tomorrow we have discussed the risk, benefits, options regarding heart catheterization.  He understands and agrees to proceed.    2.  Hypertensive urgency: His blood pressure was elevated last night although it is normal this morning.  I have written for hydralazine to be added as needed.  I added spironolactone yesterday. Potassium is still a little bit low.  We will supplement with a dose of potassium today (40 meq )   3.  History of cocaine abuse/alcohol abuse  4.  CKD stage III.  I think that he will tolerate the contrast well.  We will hydrate him prior to his cath.    For questions or updates, please contact [place] Please consult [email or web address] for contact info under        Signed, Mertie Moores, MD  12/31/2021, 9:16 AM   d

## 2021-12-31 NOTE — H&P (View-Only) (Signed)
Progress Note  Patient Name: Benjamin Pierce Date of Encounter: 12/31/2021  [facility] Cardiologist: Rollene Rotunda, MD    Subjective    66 yo admitted with a non-ST segment elevation myocardial infarction.  Coronary CTA was difficult to interpret.  He is scheduled for heart catheterization on Monday.  Has a history of hypertension.  He is currently on lisinopril. History of cocaine use.  States that he has not used any cocaine in 5 days.  BP was  elevated overnight .  BP increases when he gets agitated.  Its back to normal this am   We started spironolactone yesterday  Has diuresed 3.2 liters so far this admission Potassium is still low ( 3.3)   Inpatient Medications    Scheduled Meds:  aspirin EC  81 mg Oral Daily   lisinopril  10 mg Oral Daily   spironolactone  25 mg Oral Daily   tamsulosin  0.4 mg Oral Daily   zolpidem  5 mg Oral Once   Continuous Infusions:  heparin 1,300 Units/hr (12/30/21 2302)   PRN Meds: acetaminophen, nitroGLYCERIN, ondansetron (ZOFRAN) IV, mouth rinse   Vital Signs    Vitals:   12/30/21 2218 12/30/21 2340 12/31/21 0400 12/31/21 0755  BP: (!) 209/115 (!) 144/57 (!) 157/92 117/71  Pulse:  67 69   Resp:  11 16   Temp:  98.7 F (37.1 C) 98.4 F (36.9 C)   TempSrc:  Oral Oral   SpO2:  96% 94%   Weight:      Height:        Intake/Output Summary (Last 24 hours) at 12/31/2021 0916 Last data filed at 12/31/2021 0420 Gross per 24 hour  Intake 600 ml  Output 2450 ml  Net -1850 ml       12/28/2021   11:14 PM 10/17/2012    4:25 AM 07/01/2012    4:58 AM  Last 3 Weights  Weight (lbs) 214 lb 4.6 oz 208 lb 209 lb 1.6 oz  Weight (kg) 97.2 kg 94.348 kg 94.847 kg      Telemetry    NSR  - Personally Reviewed  ECG     - Personally Reviewed  Physical Exam   Physical Exam: Blood pressure 117/71, pulse 69, temperature 98.4 F (36.9 C), temperature source Oral, resp. rate 16, height 6' 2.5" (1.892 m), weight 97.2 kg, SpO2 94  %.  GEN:  middle age male,  HEENT: Normal NECK: No JVD; No carotid bruits LYMPHATICS: No lymphadenopathy CARDIAC: RRR , no murmurs, rubs, gallops RESPIRATORY:  Clear to auscultation without rales, wheezing or rhonchi  ABDOMEN: Soft, non-tender, non-distended MUSCULOSKELETAL:  No edema; No deformity  SKIN: Warm and dry NEUROLOGIC:  somnolent   Labs    High Sensitivity Troponin:   Recent Labs  Lab 12/28/21 1521 12/28/21 1858 12/28/21 2025  TROPONINIHS 253* 403* 427*      Chemistry Recent Labs  Lab 12/28/21 1521 12/29/21 0037 12/31/21 0122  NA 139 137 136  K 3.4* 3.3* 3.3*  CL 109 103 110  CO2 21* 28 19*  GLUCOSE 127* 113* 104*  BUN 24* 21 11  CREATININE 1.43* 1.44* 1.23  CALCIUM 8.9 8.4* 8.0*  PROT 7.4  --   --   ALBUMIN 3.8  --   --   AST 21  --   --   ALT 12  --   --   ALKPHOS 76  --   --   BILITOT 0.8  --   --   [facility]  54* 54* >60  ANIONGAP 9 6 7      Lipids  Recent Labs  Lab 12/29/21 0037  CHOL 124  TRIG 43  HDL 36*  LDLCALC 79  CHOLHDL 3.4     Hematology Recent Labs  Lab 12/29/21 0037 12/30/21 0034 12/31/21 0122  WBC 7.2 7.6 6.8  RBC 4.25 3.92* 4.01*  HGB 13.3 12.3* 12.6*  HCT 39.3 36.0* 37.0*  MCV 92.5 91.8 92.3  MCH 31.3 31.4 31.4  MCHC 33.8 34.2 34.1  RDW 13.0 13.0 12.7  PLT 222 201 182    Thyroid No results for input(s): "TSH", "FREET4" in the last 168 hours.  BNPNo results for input(s): "BNP", "PROBNP" in the last 168 hours.  DDimer No results for input(s): "DDIMER" in the last 168 hours.   Radiology    CT CORONARY MORPH W/CTA COR W/SCORE W/CA W/CM &/OR WO/CM  Addendum Date: 12/29/2021   ADDENDUM REPORT: 12/29/2021 16:22 EXAM: OVER-READ INTERPRETATION  CT CHEST The following report is an over-read performed by radiologist Dr. 12/31/2021 [facility] on 12/29/2021. This over-read does not include interpretation of cardiac or coronary anatomy or pathology. The coronary CTA interpretation by the cardiologist is  attached. COMPARISON:  None. FINDINGS: Vascular: No acute abnormality. Mediastinum/nodes: No mass identified. 1.3 cm right hilar lymph node identified. No mediastinal mass or adenopathy noted. Lungs/pleura: No pleural effusion, airspace consolidation, atelectasis or pneumothorax. No suspicious lung nodules. Upper abdomen: No acute abnormality. Musculoskeletal: No acute or suspicious osseous findings. IMPRESSION: 1. No active cardiopulmonary abnormalities. 2. Mildly enlarged right hilar lymph node noted. Nonspecific. Consider follow-up imaging in 3 months with contrast enhanced CT of the chest. Electronically Signed   By: 12/31/2021 M.D.   On: 12/29/2021 16:22   Result Date: 12/29/2021 CLINICAL DATA:  Chest pain EXAM: Cardiac CTA MEDICATIONS: Sub lingual nitro. 4mg  and lopressor 50mg  TECHNIQUE: The patient was scanned on a Siemens Force 192 slice scanner. Gantry rotation speed was 250 msecs. Collimation was .6 mm. A 100 kV prospective scan was triggered in the ascending thoracic aorta at 140 HU's Full mA was used between 35% and 75% of the R-R interval. Average HR during the scan was 61 bpm. The 3D data set was interpreted on a dedicated work station using MPR, MIP and VRT modes. A total of 80 cc of contrast was used. FINDINGS: Non-cardiac: See separate report from [facility]. No significant findings on limited lung and soft tissue windows. Calcium Score: Calcium noted in LM, LCX and LAD LM 56 LAD 250 LCX 0.61 RCA 0 Total 307 Coronary Arteries: Right dominant with no anomalies LM: 1-24% calcified plaque LAD: 1-24% calcified plaque in proximal vessel at ostium and take off of D1 Mid vessel after D1 has two mixed plaques with large soft plaque component 50-69% stenosis D1: Normal D2: Normal Circumflex: 1-24% calcified plaque in mid vessel OM1: Normal OM2: Normal RCA: Normal PDA: Normal PLA: Normal IMPRESSION: 1. Calcium score 307 which is 89 th percentile for age/sex 2.  Normal ascending thoracic aorta  3.5 cm 3. CAD RADS 3 Area of concern in mid LAD with 50-69% mixed plaque with large soft component Patient scanned twice with inability to hold breath and motion Study not suitable for FFR CT Would consider heart catheterization Electronically Signed: By: M.D. On: 12/29/2021 15:28   ECHOCARDIOGRAM COMPLETE  Result Date: 12/29/2021    ECHOCARDIOGRAM REPORT   Patient Name:   Benjamin Pierce Date of Exam: 12/29/2021 Medical Rec #:  [id number]     Height:       74.5 in Accession #:    [id number]    Weight:       214.3 lb Date of Birth:  1955/10/21      BSA:          2.248 m Patient Age:    72 years      BP:           138/86 mmHg Patient Gender: M             HR:           50 bpm. Exam Location:  Inpatient Procedure: 2D Echo, Cardiac Doppler and Color Doppler Indications:    NSTEMI I21.4  History:        Patient has prior history of Echocardiogram examinations, most                 recent 06/29/2012. Arrythmias:Bradycardia; Risk                 Factors:Hypertension and Current Smoker.  Sonographer:    Darlina Sicilian RDCS Referring Phys: [id number] [place]  Sonographer Comments: No suprasternal images, exam ended per patients request. IMPRESSIONS  1. Left ventricular ejection fraction, by estimation, is 60 to 65%. The left ventricle has normal function. The left ventricle has no regional wall motion abnormalities. There is moderate left ventricular hypertrophy. Left ventricular diastolic parameters are indeterminate.  2. Right ventricular systolic function is normal. The right ventricular size is normal. Tricuspid regurgitation signal is inadequate for assessing PA pressure.  3. The mitral valve is normal in structure. No evidence of mitral valve regurgitation.  4. The aortic valve is tricuspid. Aortic valve regurgitation is not visualized.  5. There is borderline dilatation of the aortic root, measuring 38 mm.  6. The inferior vena cava is normal in size with greater than 50% respiratory  variability, suggesting right atrial pressure of 3 mmHg. Comparison(s): No prior Echocardiogram. Conclusion(s)/Recommendation(s): Normal biventricular function without evidence of hemodynamically significant valvular heart disease. FINDINGS  Left Ventricle: Left ventricular ejection fraction, by estimation, is 60 to 65%. The left ventricle has normal function. The left ventricle has no regional wall motion abnormalities. The left ventricular internal cavity size was normal in size. There is  moderate left ventricular hypertrophy. Left ventricular diastolic parameters are indeterminate. Right Ventricle: The right ventricular size is normal. Right vetricular wall thickness was not well visualized. Right ventricular systolic function is normal. Tricuspid regurgitation signal is inadequate for assessing PA pressure. Left Atrium: Left atrial size was normal in size. Right Atrium: Right atrial size was normal in size. Pericardium: There is no evidence of pericardial effusion. Mitral Valve: The mitral valve is normal in structure. No evidence of mitral valve regurgitation. Tricuspid Valve: The tricuspid valve is normal in structure. Tricuspid valve regurgitation is trivial. Aortic Valve: The aortic valve is tricuspid. Aortic valve regurgitation is not visualized. Pulmonic Valve: The pulmonic valve was normal in structure. Pulmonic valve regurgitation is not visualized. Aorta: There is borderline dilatation of the aortic root, measuring 38 mm. Venous: The inferior vena cava is normal in size with greater than 50% respiratory variability, suggesting right atrial pressure of 3 mmHg. IAS/Shunts: No atrial level shunt detected by color flow Doppler.  LEFT VENTRICLE PLAX 2D LVIDd:         4.60 cm   Diastology LVIDs:         3.00 cm   LV e' medial:    6.32  cm/s LV PW:         1.15 cm   LV E/e' medial:  10.1 LV IVS:        1.30 cm   LV e' lateral:   8.19 cm/s LVOT diam:     2.50 cm   LV E/e' lateral: 7.8 LV SV:         84 LV SV  Index:   37 LVOT Area:     4.91 cm  RIGHT VENTRICLE RV S prime:     10.10 cm/s TAPSE (M-mode): 1.8 cm LEFT ATRIUM             Index        RIGHT ATRIUM           Index LA diam:        4.50 cm 2.00 cm/m   RA Area:     12.20 cm LA Vol (A2C):   58.9 ml 26.20 ml/m  RA Volume:   22.90 ml  10.18 ml/m LA Vol (A4C):   66.0 ml 29.35 ml/m LA Biplane Vol: 63.6 ml 28.29 ml/m  AORTIC VALVE LVOT Vmax:   82.20 cm/s LVOT Vmean:  58.100 cm/s LVOT VTI:    0.171 m  AORTA Ao Root diam: 3.80 cm Ao Asc diam:  3.70 cm MITRAL VALVE MV Area (PHT): 3.60 cm    SHUNTS MV Decel Time: 211 msec    Systemic VTI:  0.17 m MV E velocity: 63.80 cm/s  Systemic Diam: 2.50 cm MV A velocity: 50.10 cm/s MV E/A ratio:  1.27 Landscape architect signed by Phineas Inches Signature Date/Time: 12/29/2021/10:52:25 AM    Final     Cardiac Studies     Patient Profile      66 yo with presumed CAD,  presented with NSTEMI .  Scheduled for cath Monday   Assessment & Plan   Presumed coronary artery disease: Patient presented with a non-ST segment elevation myocardial infarction.  Troponins peaked at 427.  No further troponin levels were drawn.  He is currently not having any chest pain.  He is scheduled for heart catheterization tomorrow we have discussed the risk, benefits, options regarding heart catheterization.  He understands and agrees to proceed.    2.  Hypertensive urgency: His blood pressure was elevated last night although it is normal this morning.  I have written for hydralazine to be added as needed.  I added spironolactone yesterday. Potassium is still a little bit low.  We will supplement with a dose of potassium today (40 meq )   3.  History of cocaine abuse/alcohol abuse  4.  CKD stage III.  I think that he will tolerate the contrast well.  We will hydrate him prior to his cath.    For questions or updates, please contact [place] Please consult [email or web address] for contact info under        Signed, Mertie Moores, MD  12/31/2021, 9:16 AM   d
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
## 2022-01-01 ENCOUNTER — Encounter (HOSPITAL_COMMUNITY): Payer: Self-pay | Admitting: Cardiology

## 2022-01-01 ENCOUNTER — Encounter (HOSPITAL_COMMUNITY)
Admission: EM | Discharge status: Against Medical Advice | Payer: Self-pay | Source: Home / Self Care | Attending: Cardiology

## 2022-01-01 DIAGNOSIS — I2510 Atherosclerotic heart disease of native coronary artery without angina pectoris: Secondary | ICD-10-CM

## 2022-01-01 DIAGNOSIS — N401 Enlarged prostate with lower urinary tract symptoms: Secondary | ICD-10-CM

## 2022-01-01 DIAGNOSIS — Z5329 Procedure and treatment not carried out because of patient's decision for other reasons: Secondary | ICD-10-CM

## 2022-01-01 DIAGNOSIS — R079 Chest pain, unspecified: Secondary | ICD-10-CM | POA: Diagnosis not present

## 2022-01-01 DIAGNOSIS — R350 Frequency of micturition: Secondary | ICD-10-CM

## 2022-01-01 DIAGNOSIS — F17200 Nicotine dependence, unspecified, uncomplicated: Secondary | ICD-10-CM

## 2022-01-01 DIAGNOSIS — F1410 Cocaine abuse, uncomplicated: Secondary | ICD-10-CM | POA: Diagnosis not present

## 2022-01-01 DIAGNOSIS — I160 Hypertensive urgency: Secondary | ICD-10-CM | POA: Diagnosis not present

## 2022-01-01 DIAGNOSIS — I214 Non-ST elevation (NSTEMI) myocardial infarction: Secondary | ICD-10-CM | POA: Diagnosis not present

## 2022-01-01 HISTORY — PX: LEFT HEART CATH AND CORONARY ANGIOGRAPHY: CATH118249

## 2022-01-01 LAB — HEPARIN LEVEL (UNFRACTIONATED): Heparin Unfractionated: 0.33 IU/mL (ref 0.30–0.70)

## 2022-01-01 LAB — CBC
HCT: 37.8 % — ABNORMAL LOW (ref 39.0–52.0)
Hemoglobin: 13.2 g/dL (ref 13.0–17.0)
MCH: 31.4 pg (ref 26.0–34.0)
MCHC: 34.9 g/dL (ref 30.0–36.0)
MCV: 90 fL (ref 80.0–100.0)
Platelets: 212 10*3/uL (ref 150–400)
RBC: 4.2 MIL/uL — ABNORMAL LOW (ref 4.22–5.81)
RDW: 12.6 % (ref 11.5–15.5)
WBC: 8.1 10*3/uL (ref 4.0–10.5)
nRBC: 0 % (ref 0.0–0.2)

## 2022-01-01 LAB — BASIC METABOLIC PANEL
Anion gap: 8 (ref 5–15)
BUN: 14 mg/dL (ref 8–23)
CO2: 19 mmol/L — ABNORMAL LOW (ref 22–32)
Calcium: 8.5 mg/dL — ABNORMAL LOW (ref 8.9–10.3)
Chloride: 112 mmol/L — ABNORMAL HIGH (ref 98–111)
Creatinine, Ser: 1.47 mg/dL — ABNORMAL HIGH (ref 0.61–1.24)
GFR, Estimated: 53 mL/min — ABNORMAL LOW (ref >60)
Glucose, Bld: 88 mg/dL (ref 70–99)
Potassium: 4.2 mmol/L (ref 3.5–5.1)
Sodium: 139 mmol/L (ref 135–145)

## 2022-01-01 SURGERY — LEFT HEART CATH AND CORONARY ANGIOGRAPHY
Anesthesia: LOCAL

## 2022-01-01 MED ORDER — HEPARIN SODIUM (PORCINE) 1000 UNIT/ML IJ SOLN
INTRAMUSCULAR | Status: DC | PRN
  Administered 2022-01-01: 5000 [IU] via INTRAVENOUS

## 2022-01-01 MED ORDER — HEPARIN SODIUM (PORCINE) 1000 UNIT/ML IJ SOLN
INTRAMUSCULAR | Status: AC
  Filled 2022-01-01: qty 10

## 2022-01-01 MED ORDER — SODIUM CHLORIDE 0.9% FLUSH: 3.0000 mL | INTRAVENOUS | Status: DC | PRN

## 2022-01-01 MED ORDER — SODIUM CHLORIDE 0.9% FLUSH: 3.0000 mL | Freq: Two times a day (BID) | INTRAVENOUS | Status: DC

## 2022-01-01 MED ORDER — MIDAZOLAM HCL 2 MG/2ML IJ SOLN
INTRAMUSCULAR | Status: DC | PRN
  Administered 2022-01-01: 2 mg via INTRAVENOUS

## 2022-01-01 MED ORDER — FENTANYL CITRATE (PF) 100 MCG/2ML IJ SOLN
INTRAMUSCULAR | Status: DC | PRN
  Administered 2022-01-01: 25 ug via INTRAVENOUS

## 2022-01-01 MED ORDER — ENOXAPARIN SODIUM 40 MG/0.4ML IJ SOSY: 40.0000 mg | PREFILLED_SYRINGE | INTRAMUSCULAR | Status: DC

## 2022-01-01 MED ORDER — VERAPAMIL HCL 2.5 MG/ML IV SOLN
INTRAVENOUS | Status: DC | PRN
  Administered 2022-01-01: 10 mL via INTRA_ARTERIAL

## 2022-01-01 MED ORDER — ISOSORBIDE MONONITRATE ER 30 MG PO TB24
30.0000 mg | ORAL_TABLET | Freq: Every day | ORAL | Status: DC
  Administered 2022-01-01: 30 mg via ORAL
  Filled 2022-01-01: qty 1

## 2022-01-01 MED ORDER — SODIUM CHLORIDE 0.9 % IV SOLN: 250.0000 mL | INTRAVENOUS | Status: DC | PRN

## 2022-01-01 MED ORDER — VERAPAMIL HCL 2.5 MG/ML IV SOLN
INTRAVENOUS | Status: AC
  Filled 2022-01-01: qty 2

## 2022-01-01 MED ORDER — FENTANYL CITRATE (PF) 100 MCG/2ML IJ SOLN
INTRAMUSCULAR | Status: AC
  Filled 2022-01-01: qty 2

## 2022-01-01 MED ORDER — HEPARIN (PORCINE) IN NACL 1000-0.9 UT/500ML-% IV SOLN
INTRAVENOUS | Status: DC | PRN
  Administered 2022-01-01 (×2): 500 mL

## 2022-01-01 MED ORDER — HYDRALAZINE HCL 20 MG/ML IJ SOLN: 10.0000 mg | INTRAMUSCULAR | Status: AC | PRN

## 2022-01-01 MED ORDER — HYDRALAZINE HCL 20 MG/ML IJ SOLN
INTRAMUSCULAR | Status: DC | PRN
  Administered 2022-01-01: 10 mg via INTRAVENOUS

## 2022-01-01 MED ORDER — LIDOCAINE HCL (PF) 1 % IJ SOLN
INTRAMUSCULAR | Status: DC | PRN
  Administered 2022-01-01: 2 mL via INTRADERMAL

## 2022-01-01 MED ORDER — SODIUM CHLORIDE 0.9 % WEIGHT BASED INFUSION
3.0000 mL/kg/h | INTRAVENOUS | Status: DC
  Administered 2022-01-01: 3 mL/kg/h via INTRAVENOUS

## 2022-01-01 MED ORDER — AMLODIPINE BESYLATE 10 MG PO TABS
10.0000 mg | ORAL_TABLET | Freq: Every day | ORAL | Status: DC
Start: 2022-01-01 — End: 2022-01-01

## 2022-01-01 MED ORDER — HEPARIN (PORCINE) IN NACL 1000-0.9 UT/500ML-% IV SOLN
INTRAVENOUS | Status: AC
  Filled 2022-01-01: qty 1000

## 2022-01-01 MED ORDER — SODIUM CHLORIDE 0.9 % WEIGHT BASED INFUSION
1.0000 mL/kg/h | INTRAVENOUS | Status: AC
  Administered 2022-01-01: 1 mL/kg/h via INTRAVENOUS

## 2022-01-01 MED ORDER — HYDRALAZINE HCL 20 MG/ML IJ SOLN
INTRAMUSCULAR | Status: AC
  Filled 2022-01-01: qty 1

## 2022-01-01 MED ORDER — LISINOPRIL 20 MG PO TABS
20.0000 mg | ORAL_TABLET | Freq: Every day | ORAL | Status: DC
  Administered 2022-01-01: 20 mg via ORAL
  Filled 2022-01-01: qty 1

## 2022-01-01 MED ORDER — ASPIRIN 81 MG PO CHEW
81.0000 mg | CHEWABLE_TABLET | ORAL | Status: AC
  Administered 2022-01-01: 81 mg via ORAL
  Filled 2022-01-01: qty 1

## 2022-01-01 MED ORDER — HYDRALAZINE HCL 25 MG PO TABS
25.0000 mg | ORAL_TABLET | Freq: Three times a day (TID) | ORAL | Status: DC
  Administered 2022-01-01 (×2): 25 mg via ORAL
  Filled 2022-01-01 (×2): qty 1

## 2022-01-01 MED ORDER — AMLODIPINE BESYLATE 5 MG PO TABS: 5.0000 mg | ORAL_TABLET | Freq: Every day | ORAL | Status: DC

## 2022-01-01 MED ORDER — MIDAZOLAM HCL 2 MG/2ML IJ SOLN
INTRAMUSCULAR | Status: AC
  Filled 2022-01-01: qty 2

## 2022-01-01 MED ORDER — SODIUM CHLORIDE 0.9 % WEIGHT BASED INFUSION: 1.0000 mL/kg/h | INTRAVENOUS | Status: DC

## 2022-01-01 MED ORDER — LIDOCAINE HCL (PF) 1 % IJ SOLN
INTRAMUSCULAR | Status: AC
  Filled 2022-01-01: qty 30

## 2022-01-01 MED ORDER — AMLODIPINE BESYLATE 5 MG PO TABS
5.0000 mg | ORAL_TABLET | Freq: Every day | ORAL | Status: DC
  Administered 2022-01-01: 5 mg via ORAL
  Filled 2022-01-01: qty 1

## 2022-01-01 MED ORDER — ASPIRIN 81 MG PO TBEC: 81.0000 mg | DELAYED_RELEASE_TABLET | Freq: Every day | ORAL | Status: DC

## 2022-01-01 MED ORDER — IOHEXOL 350 MG/ML SOLN
INTRAVENOUS | Status: DC | PRN
  Administered 2022-01-01: 50 mL

## 2022-01-01 SURGICAL SUPPLY — 9 items
BAND ZEPHYR COMPRESS 30 LONG (HEMOSTASIS) IMPLANT
CATH 5FR JL3.5 JR4 ANG PIG MP (CATHETERS) IMPLANT
GLIDESHEATH SLEND SS 6F .021 (SHEATH) IMPLANT
GUIDEWIRE INQWIRE 1.5J.035X260 (WIRE) IMPLANT
INQWIRE 1.5J .035X260CM (WIRE) ×2
KIT HEART LEFT (KITS) ×1 IMPLANT
PACK CARDIAC CATHETERIZATION (CUSTOM PROCEDURE TRAY) ×1 IMPLANT
TRANSDUCER W/STOPCOCK (MISCELLANEOUS) ×1 IMPLANT
TUBING CIL FLEX 10 FLL-RA (TUBING) ×1 IMPLANT

## 2022-01-01 NOTE — Progress Notes (Signed)
Patient transport by wheelchair to 3E08 by NT. Pt belonging sent with patient.

## 2022-01-01 NOTE — Progress Notes (Signed)
Progress Note  Patient Name: Benjamin Pierce Date of Encounter: 01/01/2022  [facility] Cardiologist: Minus Breeding, MD    Subjective    66 yo admitted with a non-ST segment elevation myocardial infarction.  Coronary CTA was difficult to interpret.  He is scheduled for heart catheterization on Monday.  Has a history of hypertension.  He is currently on lisinopril. History of cocaine use.  States that he has not used any cocaine in 5 days.  Cath today showed 2 vessel CAD - with suspected culprit lesion was a 90% first diagonal. There is a 75% stenosis in the mid to distal LCx. Intervention was not performed, opting for adequate medical therapy first and cessation of cocaine use. The diagonal was felt to be too small for PCI -could consider PCI of the LCx for refractory angina.  Inpatient Medications    Scheduled Meds:  amLODipine  5 mg Oral Daily   [START ON 01/02/2022] aspirin EC  81 mg Oral Daily   [START ON 01/02/2022] enoxaparin (LOVENOX) injection  40 mg Subcutaneous Q24H   lisinopril  10 mg Oral Daily   sodium chloride flush  3 mL Intravenous Q12H   spironolactone  25 mg Oral Daily   tamsulosin  0.4 mg Oral Daily   zolpidem  5 mg Oral Once   Continuous Infusions:  sodium chloride     sodium chloride 1 mL/kg/hr (01/01/22 0824)   PRN Meds: sodium chloride, acetaminophen, hydrALAZINE, hydrALAZINE, nitroGLYCERIN, ondansetron (ZOFRAN) IV, mouth rinse, sodium chloride flush   Vital Signs    Vitals:   01/01/22 0751 01/01/22 0756 01/01/22 0820 01/01/22 0822  BP: (!) 157/98 (!) 177/111 (!) 156/95   Pulse: 71 (!) 0 66 62  Resp: 14  11 15   Temp:   97.6 F (36.4 C)   TempSrc:   Oral   SpO2: 96%  100% 100%  Weight:      Height:        Intake/Output Summary (Last 24 hours) at 01/01/2022 0830 Last data filed at 01/01/2022 0756 Gross per 24 hour  Intake 1232.18 ml  Output 500 ml  Net 732.18 ml      01/01/2022    4:22 AM 12/28/2021   11:14 PM 10/17/2012    4:25 AM  Last  3 Weights  Weight (lbs) 218 lb 14.7 oz 214 lb 4.6 oz 208 lb  Weight (kg) 99.3 kg 97.2 kg 94.348 kg      Telemetry    NSR  - Personally Reviewed  ECG    N/A  Physical Exam   Physical Exam: Blood pressure (!) 156/95, pulse 62, temperature 97.6 F (36.4 C), temperature source Oral, resp. rate 15, height 6' 2.5" (1.892 m), weight 99.3 kg, SpO2 100 %.  GEN:  middle age male, NAD  HEENT: Normal NECK: No JVD; No carotid bruits LYMPHATICS: No lymphadenopathy CARDIAC: RRR , no murmurs, rubs, gallops RESPIRATORY:  Clear to auscultation without rales, wheezing or rhonchi  ABDOMEN: Soft, non-tender, non-distended MUSCULOSKELETAL:  No edema; No deformity  SKIN: Warm and dry, right TR band in place NEUROLOGIC:  A&Ox3, pleasant  Labs    High Sensitivity Troponin:   Recent Labs  Lab 12/28/21 1521 12/28/21 1858 12/28/21 2025  TROPONINIHS 253* 403* 427*     Chemistry Recent Labs  Lab 12/28/21 1521 12/29/21 0037 12/31/21 0122 01/01/22 0131  NA 139 137 136 139  K 3.4* 3.3* 3.3* 4.2  CL 109 103 110 112*  CO2 21* 28 19* 19*  GLUCOSE 127*  113* 104* 88  BUN 24* 21 11 14   CREATININE 1.43* 1.44* 1.23 1.47*  CALCIUM 8.9 8.4* 8.0* 8.5*  PROT 7.4  --   --   --   ALBUMIN 3.8  --   --   --   AST 21  --   --   --   ALT 12  --   --   --   ALKPHOS 76  --   --   --   BILITOT 0.8  --   --   --   GFRNONAA 54* 54* >60 53*  ANIONGAP 9 6 7 8     Lipids  Recent Labs  Lab 12/29/21 0037  CHOL 124  TRIG 43  HDL 36*  LDLCALC 79  CHOLHDL 3.4    Hematology Recent Labs  Lab 12/30/21 0034 12/31/21 0122 01/01/22 0131  WBC 7.6 6.8 8.1  RBC 3.92* 4.01* 4.20*  HGB 12.3* 12.6* 13.2  HCT 36.0* 37.0* 37.8*  MCV 91.8 92.3 90.0  MCH 31.4 31.4 31.4  MCHC 34.2 34.1 34.9  RDW 13.0 12.7 12.6  PLT 201 182 212   Thyroid No results for input(s): "TSH", "FREET4" in the last 168 hours.  BNPNo results for input(s): "BNP", "PROBNP" in the last 168 hours.  DDimer No results for input(s):  "DDIMER" in the last 168 hours.   Radiology    CARDIAC CATHETERIZATION  Result Date: 01/01/2022   Prox LAD to Mid LAD lesion is 45% stenosed.   Dist LAD lesion is 50% stenosed.   1st Diag lesion is 90% stenosed.   Ost LAD to Prox LAD lesion is 40% stenosed.   Mid Cx to Dist Cx lesion is 75% stenosed.   Prox RCA lesion is 25% stenosed.   LV end diastolic pressure is normal. 2 vessel obstructive CAD. Suspect the culprit lesion is the  distal first diagonal. There is a 75% stenosis in the LCx at acutely angulated segment. Moderate plaqueing throughout the LAD which is a large wrap around vessel. Normal LVEDP Plan: patient needs good medical therapy with optimal BP and cholesterol control and antianginal therapy. Consider Plavix for ACS. Needs to quit using cocaine. If patient has refractory symptoms despite good medical therapy could consider PCI of the LCx lesion. The diagonal is too small for PCI.    Cardiac Studies   See cath above  Patient Profile      66 yo with presumed CAD,  presented with NSTEMI .  Scheduled for cath Monday   Assessment & Plan   Presumed coronary artery disease: Patient presented with a non-ST segment elevation myocardial infarction in the setting of recent cocaine use. Troponins peaked at 427.  No further troponin levels were drawn.  He is currently not having any chest pain. Echo showed LVEF 60-65%. Coronary CT showed CAC score of 307, 89th percentile - moderate mid-LAD stenosis.  Cath today showed 2 vessel disease with significant D1 stenosis (vessel to small to intervene upon) and LCx stenosis of 75% - medical therapy recommended as he remains quite hypertensive.     2.  Hypertensive urgency: His blood pressure was elevated last night although it is normal this morning.  I have written for hydralazine to be added as needed.  I added spironolactone yesterday. Amlodipine 5 mg daily to start today. Start scheduled hydralazine 25 mg TID Start imdur 30 mg daily Continue  aldactone 25 mg daily  Increase lisinopril to 20 mg daily  3.  History of cocaine abuse/alcohol abuse  4.  CKD stage III.  Monitor creatinine post-cath. Creatinine up to 1.47 today (was 1.44 at baseline).  5.  BPH: Has noted increased urinary frequency and weaker stream over the past year. Denies any dysuria, burning or discharge. Urinalysis was benign. Suspect BPH. Continue tamsulosin 0.4 mg QHS.  For questions or updates, please contact [facility] Please consult [email or web address] for contact info under   Chrystie Nose, MD, Milagros Loll  [place]  [facility]  Medical Director of the [facility] Lipid Disorders &  Cardiovascular Risk Reduction Clinic Diplomate of the American Board of Clinical Lipidology Attending Cardiologist  Direct Dial: [phone]  Fax: [phone]  Website:  [email or web address]  Chrystie Nose, MD  01/01/2022, 8:30 AM   d

## 2022-01-01 NOTE — Progress Notes (Signed)
Mobility Specialist Progress Note    01/01/22 1500  Mobility  Activity Refused mobility   Pt stated he did not feel like it. Will f/u as schedule permits.   Leslie Nation Mobility Specialist

## 2022-01-01 NOTE — Interval H&P Note (Signed)
History and Physical Interval Note:  01/01/2022 7:26 AM  Benjamin Pierce  has presented today for surgery, with the diagnosis of nstemi.  The various methods of treatment have been discussed with the patient and family. After consideration of risks, benefits and other options for treatment, the patient has consented to  Procedure(s): LEFT HEART CATH AND CORONARY ANGIOGRAPHY (N/A) as a surgical intervention.  The patient's history has been reviewed, patient examined, no change in status, stable for surgery.  I have reviewed the patient's chart and labs.  Questions were answered to the patient's satisfaction.   Cath Lab Visit (complete for each Cath Lab visit)  Clinical Evaluation Leading to the Procedure:   ACS: Yes.    Non-ACS:    Anginal Classification: CCS IV  Anti-ischemic medical therapy: No Therapy  Non-Invasive Test Results: Low-risk stress test findings: cardiac mortality <1%/year  Prior CABG: No previous CABG        Theron Arista [facility] 01/01/2022 7:26 AM

## 2022-01-01 NOTE — Progress Notes (Signed)
Patient wanting to leave AMA. Cardiology team A text paged in regards to patient wanting to leave.

## 2022-01-01 NOTE — Care Management Important Message (Signed)
Important Message  Patient Details  Name: Benjamin Pierce MRN: [id number] Date of Birth: [birth year]   Medicare Important Message Given:  Yes     Dorena Bodo 01/01/2022, 2:47 PM

## 2022-01-01 NOTE — Plan of Care (Signed)

## 2022-01-03 NOTE — Discharge Summary (Signed)
Discharge Summary    Patient ID: [name] MRN: [id number]; DOB: 09-05-1955  Admit date: 12/28/2021 Discharge date: 01/03/2022  PCP:  Patient, No Pcp Per   [facility] Providers Cardiologist:  Rollene Rotunda, MD   {  Discharge Diagnoses    Principal Problem:   NSTEMI (non-ST elevated myocardial infarction) [facility]) Active Problems:   Chest pain   Cocaine use disorder (HCC)   Hypertensive urgency   BPH (benign prostatic hyperplasia)   Tobacco use disorder  Diagnostic Studies/Procedures    LEFT HEART CATH AND CORONARY ANGIOGRAPHY 01/01/22   Conclusion      Prox LAD to Mid LAD lesion is 45% stenosed.   Dist LAD lesion is 50% stenosed.   1st Diag lesion is 90% stenosed.   Ost LAD to Prox LAD lesion is 40% stenosed.   Mid Cx to Dist Cx lesion is 75% stenosed.   Prox RCA lesion is 25% stenosed.   LV end diastolic pressure is normal.   2 vessel obstructive CAD. Suspect the culprit lesion is the  distal first diagonal. There is a 75% stenosis in the LCx at acutely angulated segment. Moderate plaqueing throughout the LAD which is a large wrap around vessel. Normal LVEDP   Plan: patient needs good medical therapy with optimal BP and cholesterol control and antianginal therapy. Consider Plavix for ACS. Needs to quit using cocaine. If patient has refractory symptoms despite good medical therapy could consider PCI of the LCx lesion. The diagonal is too small for PCI.   Diagnostic Dominance: Left   Echo 12/29/21 1. Left ventricular ejection fraction, by estimation, is 60 to 65%. The  left ventricle has normal function. The left ventricle has no regional  wall motion abnormalities. There is moderate left ventricular hypertrophy.  Left ventricular diastolic  parameters are indeterminate.   2. Right ventricular systolic function is normal. The right ventricular  size is normal. Tricuspid regurgitation signal is inadequate for assessing  PA pressure.   3. The mitral valve is  normal in structure. No evidence of mitral valve  regurgitation.   4. The aortic valve is tricuspid. Aortic valve regurgitation is not  visualized.   5. There is borderline dilatation of the aortic root, measuring 38 mm.   6. The inferior vena cava is normal in size with greater than 50%  respiratory variability, suggesting right atrial pressure of 3 mmHg.   Comparison(s): No prior Echocardiogram.   Conclusion(s)/Recommendation(s): Normal biventricular function without  evidence of hemodynamically significant valvular heart disease.   History of Present Illness     Benjamin Pierce is a 66 y.o. male with  a history of uncontrolled hypertension, active cocaine use, tobacco use disorder, CKD 3A admitted for chest pain.   He had acute onset left-sided chest pain that occurred while walking.  The pain then radiated to the center of the chest and felt like a tightness.  This pain resolved after 10 to 15 minutes of rest.  Pain has not recurred since that time.  He denies any associated nausea, diaphoresis, or dyspnea.  He also reports that he has been off of any antihypertensive therapy for the past 4 years as he felt his blood pressure was no longer elevated after decreasing his weight lifting exercises.   Of note, the patient presented similarly in 2014 and had a complete work-up that showed no significant coronary artery disease.   In the ED his work-up was notable for positive troponin with ECG showing LVH with no acute ischemic changes  apparent.   He also complains of frequent urination particularly at night.  Hospital Course     Consultants: None  NSTEMI -Troponins peaked at 427. Treated with heparin.  Coronary CTA with calcium score of 307.  Mid LAD with 50 to 69% mixed plaque. Cath with 2 vessel obstructive CAD. Suspect the culprit lesion is the  distal first diagonal. There is a 75% stenosis in the LCx at acutely angulated segment. Moderate plaqueing throughout the LAD which is a large  wrap around vessel.  Medical therapy recommended.  Could consider PCI of circumflex if refractory angina.  2.  Hypertensive urgency:  Hypertensive on arrival.  Added medication with improved blood pressure   3.  History of cocaine abuse/alcohol abuse   4.  CKD stage III.  I think that he will tolerate the contrast well.  We will hydrate him prior to his cath.  5.  BPH: Has noted increased urinary frequency and weaker stream over the past year. Denies any dysuria, burning or discharge. Urinalysis was benign. Suspect BPH. Continue tamsulosin 0.4 mg QHS.   Unfortunately patient left AMA following cardiac catheterization.  I was told to perform discharge summary 2 days later.  Unsure what medication patient is taking.  Will send message to scheduling to arrange outpatient follow-up if patient reachable.  Did the patient have an acute coronary syndrome (MI, NSTEMI, STEMI, etc) this admission?:  No                               Did the patient have a percutaneous coronary intervention (stent / angioplasty)?:  No.          _____________  Discharge Vitals Blood pressure (!) 167/89, pulse 76, temperature 97.7 F (36.5 C), temperature source Oral, resp. rate 18, height 6' 2.5" (1.892 m), weight 96.6 kg, SpO2 99 %.  Filed Weights   12/28/21 2314 01/01/22 0422 01/01/22 1146  Weight: 97.2 kg 99.3 kg 96.6 kg    Labs & Radiologic Studies    CBC Recent Labs    01/01/22 0131  WBC 8.1  HGB 13.2  HCT 37.8*  MCV 90.0  PLT 212   Basic Metabolic Panel Recent Labs    15/94/58 0131  NA 139  K 4.2  CL 112*  CO2 19*  GLUCOSE 88  BUN 14  CREATININE 1.47*  CALCIUM 8.5*   Liver Function Tests No results for input(s): "AST", "ALT", "ALKPHOS", "BILITOT", "PROT", "ALBUMIN" in the last 72 hours. No results for input(s): "LIPASE", "AMYLASE" in the last 72 hours. High Sensitivity Troponin:   Recent Labs  Lab 12/28/21 1521 12/28/21 1858 12/28/21 2025  TROPONINIHS 253* 403* 427*     BNP Invalid input(s): "POCBNP" D-Dimer No results for input(s): "DDIMER" in the last 72 hours. Hemoglobin A1C No results for input(s): "HGBA1C" in the last 72 hours. Fasting Lipid Panel No results for input(s): "CHOL", "HDL", "LDLCALC", "TRIG", "CHOLHDL", "LDLDIRECT" in the last 72 hours. Thyroid Function Tests No results for input(s): "TSH", "T4TOTAL", "T3FREE", "THYROIDAB" in the last 72 hours.  Invalid input(s): "FREET3" _____________  CARDIAC CATHETERIZATION  Result Date: 01/01/2022   Prox LAD to Mid LAD lesion is 45% stenosed.   Dist LAD lesion is 50% stenosed.   1st Diag lesion is 90% stenosed.   Ost LAD to Prox LAD lesion is 40% stenosed.   Mid Cx to Dist Cx lesion is 75% stenosed.   Prox RCA lesion is 25% stenosed.  LV end diastolic pressure is normal. 2 vessel obstructive CAD. Suspect the culprit lesion is the  distal first diagonal. There is a 75% stenosis in the LCx at acutely angulated segment. Moderate plaqueing throughout the LAD which is a large wrap around vessel. Normal LVEDP Plan: patient needs good medical therapy with optimal BP and cholesterol control and antianginal therapy. Consider Plavix for ACS. Needs to quit using cocaine. If patient has refractory symptoms despite good medical therapy could consider PCI of the LCx lesion. The diagonal is too small for PCI.   CT CORONARY MORPH W/CTA COR W/SCORE W/CA W/CM &/OR WO/CM  Addendum Date: 12/29/2021   ADDENDUM REPORT: 12/29/2021 16:22 EXAM: OVER-READ INTERPRETATION  CT CHEST The following report is an over-read performed by radiologist Dr. Norlene Duel [facility] on 12/29/2021. This over-read does not include interpretation of cardiac or coronary anatomy or pathology. The coronary CTA interpretation by the cardiologist is attached. COMPARISON:  None. FINDINGS: Vascular: No acute abnormality. Mediastinum/nodes: No mass identified. 1.3 cm right hilar lymph node identified. No mediastinal mass or adenopathy  noted. Lungs/pleura: No pleural effusion, airspace consolidation, atelectasis or pneumothorax. No suspicious lung nodules. Upper abdomen: No acute abnormality. Musculoskeletal: No acute or suspicious osseous findings. IMPRESSION: 1. No active cardiopulmonary abnormalities. 2. Mildly enlarged right hilar lymph node noted. Nonspecific. Consider follow-up imaging in 3 months with contrast enhanced CT of the chest. Electronically Signed   By: Kerby Moors M.D.   On: 12/29/2021 16:22   Result Date: 12/29/2021 CLINICAL DATA:  Chest pain EXAM: Cardiac CTA MEDICATIONS: Sub lingual nitro. 4mg  and lopressor 50mg  TECHNIQUE: The patient was scanned on a Siemens Force [id number] slice scanner. Gantry rotation speed was 250 msecs. Collimation was .6 mm. A 100 kV prospective scan was triggered in the ascending thoracic aorta at 140 HU's Full mA was used between 35% and 75% of the R-R interval. Average HR during the scan was 61 bpm. The 3D data set was interpreted on a dedicated work station using MPR, MIP and VRT modes. A total of 80 cc of contrast was used. FINDINGS: Non-cardiac: See separate report from [facility]. No significant findings on limited lung and soft tissue windows. Calcium Score: Calcium noted in LM, LCX and LAD LM 56 LAD 250 LCX 0.61 RCA 0 Total 307 Coronary Arteries: Right dominant with no anomalies LM: 1-24% calcified plaque LAD: 1-24% calcified plaque in proximal vessel at ostium and take off of D1 Mid vessel after D1 has two mixed plaques with large soft plaque component 50-69% stenosis D1: Normal D2: Normal Circumflex: 1-24% calcified plaque in mid vessel OM1: Normal OM2: Normal RCA: Normal PDA: Normal PLA: Normal IMPRESSION: 1. Calcium score 307 which is 89 th percentile for age/sex 2.  Normal ascending thoracic aorta 3.5 cm 3. CAD RADS 3 Area of concern in mid LAD with 50-69% mixed plaque with large soft component Patient scanned twice with inability to hold breath and motion Study not suitable for  FFR CT Would consider heart catheterization Jenkins Rouge Electronically Signed: By: Jenkins Rouge M.D. On: 12/29/2021 15:28   ECHOCARDIOGRAM COMPLETE  Result Date: 12/29/2021    ECHOCARDIOGRAM REPORT   Patient Name:   JACALEB PUHR Date of Exam: 12/29/2021 Medical Rec #:  [id number]     Height:       74.5 in Accession #:    [id number]    Weight:       214.3 lb Date of Birth:  December 28, 1955      BSA:  2.248 m Patient Age:    66 years      BP:           138/86 mmHg Patient Gender: M             HR:           50 bpm. Exam Location:  Inpatient Procedure: 2D Echo, Cardiac Doppler and Color Doppler Indications:    NSTEMI I21.4  History:        Patient has prior history of Echocardiogram examinations, most                 recent 06/29/2012. Arrythmias:Bradycardia; Risk                 Factors:Hypertension and Current Smoker.  Sonographer:    Darlina Sicilian RDCS Referring Phys: [id number] [place]  Sonographer Comments: No suprasternal images, exam ended per patients request. IMPRESSIONS  1. Left ventricular ejection fraction, by estimation, is 60 to 65%. The left ventricle has normal function. The left ventricle has no regional wall motion abnormalities. There is moderate left ventricular hypertrophy. Left ventricular diastolic parameters are indeterminate.  2. Right ventricular systolic function is normal. The right ventricular size is normal. Tricuspid regurgitation signal is inadequate for assessing PA pressure.  3. The mitral valve is normal in structure. No evidence of mitral valve regurgitation.  4. The aortic valve is tricuspid. Aortic valve regurgitation is not visualized.  5. There is borderline dilatation of the aortic root, measuring 38 mm.  6. The inferior vena cava is normal in size with greater than 50% respiratory variability, suggesting right atrial pressure of 3 mmHg. Comparison(s): No prior Echocardiogram. Conclusion(s)/Recommendation(s): Normal biventricular function without evidence of  hemodynamically significant valvular heart disease. FINDINGS  Left Ventricle: Left ventricular ejection fraction, by estimation, is 60 to 65%. The left ventricle has normal function. The left ventricle has no regional wall motion abnormalities. The left ventricular internal cavity size was normal in size. There is  moderate left ventricular hypertrophy. Left ventricular diastolic parameters are indeterminate. Right Ventricle: The right ventricular size is normal. Right vetricular wall thickness was not well visualized. Right ventricular systolic function is normal. Tricuspid regurgitation signal is inadequate for assessing PA pressure. Left Atrium: Left atrial size was normal in size. Right Atrium: Right atrial size was normal in size. Pericardium: There is no evidence of pericardial effusion. Mitral Valve: The mitral valve is normal in structure. No evidence of mitral valve regurgitation. Tricuspid Valve: The tricuspid valve is normal in structure. Tricuspid valve regurgitation is trivial. Aortic Valve: The aortic valve is tricuspid. Aortic valve regurgitation is not visualized. Pulmonic Valve: The pulmonic valve was normal in structure. Pulmonic valve regurgitation is not visualized. Aorta: There is borderline dilatation of the aortic root, measuring 38 mm. Venous: The inferior vena cava is normal in size with greater than 50% respiratory variability, suggesting right atrial pressure of 3 mmHg. IAS/Shunts: No atrial level shunt detected by color flow Doppler.  LEFT VENTRICLE PLAX 2D LVIDd:         4.60 cm   Diastology LVIDs:         3.00 cm   LV e' medial:    6.32 cm/s LV PW:         1.15 cm   LV E/e' medial:  10.1 LV IVS:        1.30 cm   LV e' lateral:   8.19 cm/s LVOT diam:     2.50 cm   LV E/e'  lateral: 7.8 LV SV:         84 LV SV Index:   37 LVOT Area:     4.91 cm  RIGHT VENTRICLE RV S prime:     10.10 cm/s TAPSE (M-mode): 1.8 cm LEFT ATRIUM             Index        RIGHT ATRIUM           Index LA diam:         4.50 cm 2.00 cm/m   RA Area:     12.20 cm LA Vol (A2C):   58.9 ml 26.20 ml/m  RA Volume:   22.90 ml  10.18 ml/m LA Vol (A4C):   66.0 ml 29.35 ml/m LA Biplane Vol: 63.6 ml 28.29 ml/m  AORTIC VALVE LVOT Vmax:   82.20 cm/s LVOT Vmean:  58.100 cm/s LVOT VTI:    0.171 m  AORTA Ao Root diam: 3.80 cm Ao Asc diam:  3.70 cm MITRAL VALVE MV Area (PHT): 3.60 cm    SHUNTS MV Decel Time: 211 msec    Systemic VTI:  0.17 m MV E velocity: 63.80 cm/s  Systemic Diam: 2.50 cm MV A velocity: 50.10 cm/s MV E/A ratio:  1.27 Carolan Clines Electronically signed by Carolan Clines Signature Date/Time: 12/29/2021/10:52:25 AM    Final    DG Chest 2 View  Result Date: 12/28/2021 CLINICAL DATA:  Chest pain. EXAM: CHEST - 2 VIEW COMPARISON:  October 17, 2012. FINDINGS: The heart size and mediastinal contours are within normal limits. Both lungs are clear. The visualized skeletal structures are unremarkable. IMPRESSION: No active cardiopulmonary disease. Electronically Signed   By: Lupita Raider M.D.   On: 12/28/2021 15:32   Disposition   Pt is being discharged home today in good condition.  Follow-up Plans & Appointments       Discharge Medications   Allergies as of 01/01/2022   No Known Allergies      Medication List     ASK your doctor about these medications    lisinopril 2.5 MG tablet Commonly known as: Zestril Take 1 tablet (2.5 mg total) by mouth daily.           Outstanding Labs/Studies    Duration of Discharge Encounter   Greater than 30 minutes including physician time.  Vonzella Nipple [place], PA 01/03/2022, 5:09 PM

## 2022-01-05 ENCOUNTER — Telehealth: Payer: Self-pay | Admitting: Cardiology

## 2022-01-05 NOTE — Telephone Encounter (Signed)
Benjamin Pierce, Bhavinkumar, PA  P Cv Div Nl Scheduling   Patient of Dr. Antoine Poche.  He left AMA following cardiac catheterization.  Please arrange follow-up in clinic as soon as possible if he is reachable.  Can use 24-hour slot with any PA or Dr. Antoine Poche.   01/05/22 called to schedule appt for a hospital f/u with Dr. Antoine Poche following cath but the number listed on his chart is for a homeless shelter and he is no longer there. Daughter and friends number in pts chart is not working - [name]

## 2024-02-08 ENCOUNTER — Emergency Department (HOSPITAL_BASED_OUTPATIENT_CLINIC_OR_DEPARTMENT_OTHER)
Admission: EM | Admit: 2024-02-08 | Discharge: 2024-02-08 | Discharge status: Against Medical Advice | Attending: Emergency Medicine | Admitting: Emergency Medicine

## 2024-02-08 ENCOUNTER — Other Ambulatory Visit: Payer: Self-pay

## 2024-02-08 DIAGNOSIS — I10 Essential (primary) hypertension: Secondary | ICD-10-CM | POA: Diagnosis present

## 2024-02-08 DIAGNOSIS — Z5321 Procedure and treatment not carried out due to patient leaving prior to being seen by health care provider: Secondary | ICD-10-CM | POA: Diagnosis not present

## 2024-02-08 NOTE — ED Triage Notes (Signed)
 Pt POV. I feel fine, I'm just here with my friend who is getting checked out for parasites and I'm getting checked out to I don;t have to hear her mouth.   BP 204/109 in triage, not on BP meds.

## 2024-02-08 NOTE — ED Provider Notes (Signed)
 According to RN this patient left main was before I arrived in the room.  I was unable to evaluate this patient or asked questions. Apparently patient was seen leaving the room saying that he had placed this to be and his ride was here. RN encouraged him to stay.    Benjamin Pierce, [place] 02/08/24 1650    Benjamin Lamar BROCKS, MD 02/10/24 1115

## 2024-02-08 NOTE — ED Notes (Signed)
 Pt stated he needed to leave. He had a ride outside and couldn't wait. Attempted to wait and see PA, and pt states he wasn't able to stay any longer and would come back tomorrow. PA notified and pt ambulated off unit.

## 2024-03-22 ENCOUNTER — Emergency Department (HOSPITAL_COMMUNITY)
Admission: EM | Admit: 2024-03-22 | Discharge: 2024-03-22 | Disposition: A | Discharge status: Home / Self Care | Attending: Emergency Medicine | Admitting: Emergency Medicine

## 2024-03-22 ENCOUNTER — Other Ambulatory Visit: Payer: Self-pay

## 2024-03-22 ENCOUNTER — Emergency Department (HOSPITAL_COMMUNITY)

## 2024-03-22 ENCOUNTER — Encounter (HOSPITAL_COMMUNITY): Payer: Self-pay

## 2024-03-22 DIAGNOSIS — I10 Essential (primary) hypertension: Secondary | ICD-10-CM | POA: Diagnosis not present

## 2024-03-22 DIAGNOSIS — Z79899 Other long term (current) drug therapy: Secondary | ICD-10-CM | POA: Insufficient documentation

## 2024-03-22 DIAGNOSIS — S51812A Laceration without foreign body of left forearm, initial encounter: Secondary | ICD-10-CM

## 2024-03-22 DIAGNOSIS — S59912A Unspecified injury of left forearm, initial encounter: Secondary | ICD-10-CM | POA: Diagnosis present

## 2024-03-22 DIAGNOSIS — S51822A Laceration with foreign body of left forearm, initial encounter: Secondary | ICD-10-CM | POA: Diagnosis not present

## 2024-03-22 DIAGNOSIS — F17200 Nicotine dependence, unspecified, uncomplicated: Secondary | ICD-10-CM | POA: Insufficient documentation

## 2024-03-22 DIAGNOSIS — Z23 Encounter for immunization: Secondary | ICD-10-CM | POA: Diagnosis not present

## 2024-03-22 MED ORDER — LIDOCAINE-EPINEPHRINE-TETRACAINE (LET) TOPICAL GEL
3.0000 mL | Freq: Once | TOPICAL | Status: AC
  Administered 2024-03-22: 3 mL via TOPICAL
  Filled 2024-03-22: qty 3

## 2024-03-22 MED ORDER — CEPHALEXIN 500 MG PO CAPS: 500.0000 mg | ORAL_CAPSULE | Freq: Four times a day (QID) | ORAL | 0 refills | Status: AC

## 2024-03-22 MED ORDER — TETANUS-DIPHTH-ACELL PERTUSSIS 5-2-15.5 LF-MCG/0.5 IM SUSP
0.5000 mL | Freq: Once | INTRAMUSCULAR | Status: AC
  Administered 2024-03-22: 0.5 mL via INTRAMUSCULAR
  Filled 2024-03-22: qty 0.5

## 2024-03-22 MED ORDER — BACITRACIN ZINC 500 UNIT/GM EX OINT: TOPICAL_OINTMENT | Freq: Every day | CUTANEOUS | Status: DC

## 2024-03-22 MED ORDER — LIDOCAINE-EPINEPHRINE (PF) 2 %-1:200000 IJ SOLN
10.0000 mL | Freq: Once | INTRAMUSCULAR | Status: DC
  Filled 2024-03-22: qty 20

## 2024-03-22 MED ORDER — LISINOPRIL 2.5 MG PO TABS: 2.5000 mg | ORAL_TABLET | Freq: Every day | ORAL | 0 refills | Status: AC
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
## 2024-03-22 NOTE — ED Notes (Signed)
 PA at bedside.
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
## 2024-03-22 NOTE — Discharge Instructions (Addendum)
 Start Keflex, 1 tablet by mouth 4 times daily for 5 days. Monitor for signs and symptoms of wound infection, including redness/swelling/warmth/drainage from the site of your wound, or if you develop a fever. Return to the emergency department or urgent care in 7-10 days to have your staples removed.  I have provided you with the contact information for [place], please schedule follow-up to establish care since you do not have a primary care provider.  You were also found to be hypertensive in the emergency department today, I have refilled your prescription for lisinopril , which is used to treat hypertension.  Please pick this medication up from the pharmacy and use as directed.  Return to the emergency department if your symptoms worsen.
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
